# Patient Record
Sex: Female | Born: 1975 | Race: White | Hispanic: No | Marital: Married | Smoking: Former smoker
Health system: Southern US, Community
[De-identification: ages and names within clinical notes are randomized; demographics above are authoritative.]

## PROBLEM LIST (undated history)

## (undated) DIAGNOSIS — J4520 Mild intermittent asthma, uncomplicated: Secondary | ICD-10-CM

## (undated) DIAGNOSIS — T783XXA Angioneurotic edema, initial encounter: Secondary | ICD-10-CM

## (undated) DIAGNOSIS — L509 Urticaria, unspecified: Secondary | ICD-10-CM

## (undated) DIAGNOSIS — J45909 Unspecified asthma, uncomplicated: Secondary | ICD-10-CM

## (undated) DIAGNOSIS — G8929 Other chronic pain: Secondary | ICD-10-CM

## (undated) HISTORY — PX: TUBAL LIGATION: SHX77

## (undated) HISTORY — PX: BACK SURGERY: SHX140

## (undated) HISTORY — PX: ENDOMETRIAL ABLATION: SHX621

## (undated) HISTORY — DX: Urticaria, unspecified: L50.9

---
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
## 2016-12-24 DIAGNOSIS — J189 Pneumonia, unspecified organism: Secondary | ICD-10-CM | POA: Insufficient documentation

## 2016-12-24 DIAGNOSIS — E876 Hypokalemia: Secondary | ICD-10-CM | POA: Insufficient documentation

## 2016-12-24 DIAGNOSIS — R197 Diarrhea, unspecified: Secondary | ICD-10-CM | POA: Insufficient documentation

## 2016-12-24 DIAGNOSIS — J9601 Acute respiratory failure with hypoxia: Secondary | ICD-10-CM | POA: Insufficient documentation

## 2018-06-17 ENCOUNTER — Emergency Department
Admission: EM | Admit: 2018-06-17 | Discharge: 2018-06-17 | Disposition: A | Discharge status: Home / Self Care | Payer: BLUE CROSS/BLUE SHIELD | Source: Home / Self Care | Attending: Family Medicine | Admitting: Family Medicine

## 2018-06-17 ENCOUNTER — Other Ambulatory Visit: Payer: Self-pay

## 2018-06-17 ENCOUNTER — Emergency Department (INDEPENDENT_AMBULATORY_CARE_PROVIDER_SITE_OTHER): Payer: BLUE CROSS/BLUE SHIELD

## 2018-06-17 DIAGNOSIS — M7732 Calcaneal spur, left foot: Secondary | ICD-10-CM

## 2018-06-17 DIAGNOSIS — S93602A Unspecified sprain of left foot, initial encounter: Secondary | ICD-10-CM | POA: Diagnosis not present

## 2018-06-17 DIAGNOSIS — M7989 Other specified soft tissue disorders: Secondary | ICD-10-CM

## 2018-06-17 NOTE — ED Provider Notes (Signed)
[facility]    CSN: [id number] Arrival date & time: 06/17/18  [place]     History   Chief Complaint Chief Complaint  Patient presents with  . Foot Pain    HPI Amy Pope is a 42 y.o. female.   Patient tripped over a railroad tie in her yard about two hours ago, twisting her left foot.  The history is provided by the patient and the spouse.  Foot Injury  Location:  Foot Time since incident:  2 hours Injury: yes   Mechanism of injury comment:  Tripped on log Foot location:  L foot Pain details:    Quality:  Aching   Radiates to:  Does not radiate   Severity:  Moderate   Onset quality:  Sudden   Duration:  2 hours   Timing:  Constant   Progression:  Unchanged Chronicity:  New Dislocation: no   Prior injury to area:  No Relieved by:  None tried Worsened by:  Bearing weight Ineffective treatments:  Ice Associated symptoms: decreased ROM, stiffness and swelling   Associated symptoms: no muscle weakness, no numbness and no tingling   Risk factors: obesity     History reviewed. No pertinent past medical history.  There are no active problems to display for this patient.   Past Surgical History:  Procedure Laterality Date  . BACK SURGERY    . CESAREAN SECTION      OB History   None      Home Medications    Prior to Admission medications   Medication Sig Start Date End Date Taking? Authorizing Provider  ALPRAZolam Duanne Moron) 1 MG tablet Take 1 mg by mouth at bedtime as needed for anxiety.   Yes [provider]  ibuprofen (ADVIL,MOTRIN) 800 MG tablet Take 800 mg by mouth every 8 (eight) hours as needed (duexis).   Yes [provider]    Family History History reviewed. No pertinent family history.  Social History Social History   Tobacco Use  . Smoking status: Former Research scientist (life sciences)  . Smokeless tobacco: Never Used  Substance Use Topics  . Alcohol use: Not Currently  . Drug use: Not Currently     Allergies   Morphine and  related   Review of Systems Review of Systems  Musculoskeletal: Positive for stiffness.  All other systems reviewed and are negative.    Physical Exam Triage Vital Signs ED Triage Vitals  Enc Vitals Group     BP 06/17/18 1922 118/80     Pulse Rate 06/17/18 1922 (!) 105     Resp --      Temp 06/17/18 1922 98 F (36.7 C)     Temp Source 06/17/18 1922 Oral     SpO2 06/17/18 1922 100 %     Weight 06/17/18 1923 210 lb (95.3 kg)     Height 06/17/18 1923 5' 9"  (1.753 m)     Head Circumference --      Peak Flow --      Pain Score 06/17/18 1922 5     Pain Loc --      Pain Edu? --      Excl. in [place]? --    No data found.  Updated Vital Signs BP 118/80 (BP Location: Right Arm)   Pulse (!) 105   Temp 98 F (36.7 C) (Oral)   Ht 5' 9"  (1.753 m)   Wt 210 lb (95.3 kg)   SpO2 100%   BMI 31.01 kg/m   Visual Acuity Right Eye  Distance:   Left Eye Distance:   Bilateral Distance:    Right Eye Near:   Left Eye Near:    Bilateral Near:     Physical Exam  Constitutional: She appears well-developed and well-nourished. No distress.  HENT:  Head: Atraumatic.  Eyes: Pupils are equal, round, and reactive to light.  Cardiovascular: Normal rate.  Pulmonary/Chest: Effort normal.  Musculoskeletal: She exhibits no edema.       Left foot: There is tenderness and swelling. There is no deformity.       Feet:  Left foot has swelling and tenderness dorsal/lateral aspect as noted on diagram.  Ankle has full range of motion without tenderness.  Distal neurovascular function is intact.   Neurological: She is alert.  Skin: Skin is warm and dry.  Nursing note and vitals reviewed.    UC Treatments / Results  Labs (all labs ordered are listed, but only abnormal results are displayed) Labs Reviewed - No data to display  EKG None  Radiology Dg Foot Complete Left  Result Date: 06/17/2018 CLINICAL DATA:  Lateral foot pain and swelling after fall approximately 1 hour ago into a hole. EXAM:  LEFT FOOT - COMPLETE 3+ VIEW COMPARISON:  None. FINDINGS: The Lisfranc articulation appears congruent. No fracture of the foot is noted. Plantar calcaneal enthesophyte is seen. There is soft tissue swelling over the lateral aspect of the midfoot. IMPRESSION: 1. Soft tissue swelling over the lateral aspect of the midfoot. 2. No acute fracture or malalignment is identified. 3. Calcaneal enthesopathy is seen along the plantar aspect. Electronically Signed   By: Ashley Royalty M.D.   On: 06/17/2018 19:47    Procedures Procedures (including critical care time)  Medications Ordered in UC Medications - No data to display  Initial Impression / Assessment and Plan / UC Course  I have reviewed the triage vital signs and the nursing notes.  Pertinent labs & imaging results that were available during my care of the patient were reviewed by me and considered in my medical decision making (see chart for details).    Ace wrap applied.  Dispensed crutehes. Followup with Dr. Aundria Mems or Dr. Lynne Leader ([facility]) if not improving about two weeks.    Final Clinical Impressions(s) / UC Diagnoses   Final diagnoses:  Foot sprain, left, initial encounter     Discharge Instructions     Apply ice pack for 30 minutes every 1 to 2 hours today and tomorrow.  Elevate.  Use crutches for 3 to 5 days.  Wear Ace wrap until swelling decreases.  Begin range of motion and stretching exercises in about 5 days as per instruction sheet.  May increase Ibuprofen 870m to every 8 hours with food.     ED Prescriptions    None        BKandra Nicolas MD 06/18/18 [phone]

## 2018-06-17 NOTE — Discharge Instructions (Addendum)
Apply ice pack for 30 minutes every 1 to 2 hours today and tomorrow.  Elevate.  Use crutches for 3 to 5 days.  Wear Ace wrap until swelling decreases.  Begin range of motion and stretching exercises in about 5 days as per instruction sheet.  May increase Ibuprofen 878m to every 8 hours with food.

## 2018-06-17 NOTE — ED Triage Notes (Signed)
Pt fell over a log this afternoon around 6:15.  Injured right foot.  Has swelling and bruising on the lateral side of left foot.

## 2019-07-31 DIAGNOSIS — F419 Anxiety disorder, unspecified: Secondary | ICD-10-CM | POA: Insufficient documentation

## 2019-07-31 DIAGNOSIS — Z8739 Personal history of other diseases of the musculoskeletal system and connective tissue: Secondary | ICD-10-CM | POA: Insufficient documentation

## 2019-07-31 DIAGNOSIS — M545 Low back pain, unspecified: Secondary | ICD-10-CM | POA: Insufficient documentation

## 2019-09-28 ENCOUNTER — Encounter: Payer: Self-pay | Admitting: Allergy and Immunology

## 2019-09-28 ENCOUNTER — Other Ambulatory Visit: Payer: Self-pay

## 2019-09-28 ENCOUNTER — Ambulatory Visit: Payer: BLUE CROSS/BLUE SHIELD | Admitting: Allergy and Immunology

## 2019-09-28 VITALS — BP 130/82 | HR 72 | Temp 97.9°F | Resp 18 | Ht 68.9 in | Wt 223.8 lb

## 2019-09-28 DIAGNOSIS — J3089 Other allergic rhinitis: Secondary | ICD-10-CM | POA: Diagnosis not present

## 2019-09-28 DIAGNOSIS — L500 Allergic urticaria: Secondary | ICD-10-CM | POA: Insufficient documentation

## 2019-09-28 DIAGNOSIS — H1013 Acute atopic conjunctivitis, bilateral: Secondary | ICD-10-CM

## 2019-09-28 DIAGNOSIS — J4520 Mild intermittent asthma, uncomplicated: Secondary | ICD-10-CM | POA: Insufficient documentation

## 2019-09-28 DIAGNOSIS — H109 Unspecified conjunctivitis: Secondary | ICD-10-CM | POA: Insufficient documentation

## 2019-09-28 DIAGNOSIS — T7840XD Allergy, unspecified, subsequent encounter: Secondary | ICD-10-CM

## 2019-09-28 DIAGNOSIS — J302 Other seasonal allergic rhinitis: Secondary | ICD-10-CM | POA: Insufficient documentation

## 2019-09-28 DIAGNOSIS — T7840XA Allergy, unspecified, initial encounter: Secondary | ICD-10-CM | POA: Insufficient documentation

## 2019-09-28 DIAGNOSIS — H1010 Acute atopic conjunctivitis, unspecified eye: Secondary | ICD-10-CM | POA: Insufficient documentation

## 2019-09-28 HISTORY — DX: Mild intermittent asthma, uncomplicated: J45.20

## 2019-09-28 MED ORDER — PAZEO 0.7 % OP SOLN: 1.0000 [drp] | OPHTHALMIC | 5 refills | Status: DC

## 2019-09-28 MED ORDER — AZELASTINE HCL 0.15 % NA SOLN: 1.0000 | Freq: Two times a day (BID) | NASAL | 5 refills | Status: DC | PRN

## 2019-09-28 MED ORDER — ALBUTEROL SULFATE HFA 108 (90 BASE) MCG/ACT IN AERS: 1.0000 | INHALATION_SPRAY | RESPIRATORY_TRACT | 2 refills | Status: DC | PRN

## 2019-09-28 MED ORDER — LEVOCETIRIZINE DIHYDROCHLORIDE 5 MG PO TABS: 5.0000 mg | ORAL_TABLET | Freq: Every day | ORAL | 5 refills | Status: DC

## 2019-09-28 NOTE — Assessment & Plan Note (Signed)
   Treatment plan as outlined above for allergic rhinitis.  A prescription has been provided for Pazeo, one drop per eye daily as needed.

## 2019-09-28 NOTE — Patient Instructions (Addendum)
Allergic reaction The patients history suggests allergic reaction with an unclear trigger. Food allergen skin tests were negative today despite a positive histamine control.  The negative predictive value for skin tests is excellent (greater than 95%). Environmental skin test results revealed reactivity to dog epithelia, cat hair, dust mite antigen, molds, cockroach antigen, and weed pollen.  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  To be thorough, we will proceed with in vitro lab studies to help establish an etiology.  The following labs have been ordered: FCeRI antibody, anti-thyroglobulin antibody, thyroid peroxidase antibody, baseline serum tryptase, C4, CBC, CMP, ESR, ANA, and serum specific IgE against alpha gal panel.   For now, I have recommended levocetirizine (Xyzal) 5 mg daily.  A prescription has been provided.  Should symptoms recur, a journal is to be kept recording any foods eaten, beverages consumed, medications taken within a 6 hour period prior to the onset of symptoms, as well as activities performed, and environmental conditions. For any symptoms concerning for anaphylaxis, epinephrine is to be administered and 911 is to be called immediately.  Other allergic rhinitis  Aeroallergen avoidance measures have been discussed and provided in written form.  Levocetirizine(Xyzal) has been prescribed (as above).  To avoid diminishing benefit with daily use (tachyphylaxis) of second generation antihistamine, consider alternating every few months between fexofenadine (Allegra) and levocetirizine (Xyzal).  A prescription has been provided for azelastine nasal spray, 1-2 sprays per nostril 2 times daily as needed. Proper nasal spray technique has been discussed and demonstrated.   Nasal saline spray (i.e., Simply Saline) or nasal saline lavage (i.e., NeilMed) is recommended as needed and  prior to medicated nasal sprays.  Allergic conjunctivitis  Treatment plan as outlined above for allergic rhinitis.  A prescription has been provided for Pazeo, one drop per eye daily as needed.  Mild intermittent asthma  A prescription has been provided for albuterol HFA, 1 to 2 inhalations every 4-6 hours if needed.  Subjective and objective measures of pulmonary function will be followed and the treatment plan will be adjusted accordingly.   When lab results have returned you will be called with further recommendations. With the newly implemented Cures Act, the labs may be visible to you at the same time they become visible to [place]. However, the results will typically not be addressed until all of the results are back, so please be patient.  Until you have heard from [place], please continue the treatment plan as outlined on your take home sheet.   Control of Dust Mite Allergen  House dust mites play a major role in allergic asthma and rhinitis.  They occur in environments with high humidity wherever human skin, the food for dust mites is found. High levels have been detected in dust obtained from mattresses, pillows, carpets, upholstered furniture, bed covers, clothes and soft toys.  The principal allergen of the house dust mite is found in its feces.  A gram of dust may contain 1,000 mites and 250,000 fecal particles.  Mite antigen is easily measured in the air during house cleaning activities.    1. Encase mattresses, including the box spring, and pillow, in an air tight cover.  Seal the zipper end of the encased mattresses with wide adhesive tape. 2. Wash the bedding in water of 130 degrees Farenheit weekly.  Avoid cotton comforters/quilts and flannel bedding: the most ideal bed covering is  the dacron comforter. 3. Remove all upholstered furniture from the bedroom. 4. Remove carpets, carpet padding, rugs, and non-washable window drapes from the bedroom.  Wash drapes weekly or use plastic  window coverings. 5. Remove all non-washable stuffed toys from the bedroom.  Wash stuffed toys weekly. 6. Have the room cleaned frequently with a vacuum cleaner and a damp dust-mop.  The patient should not be in a room which is being cleaned and should wait 1 hour after cleaning before going into the room. 7. Close and seal all heating outlets in the bedroom.  Otherwise, the room will become filled with dust-laden air.  An electric heater can be used to heat the room. Reduce indoor humidity to less than 50%.  Do not use a humidifier.   Reducing Pollen Exposure  The American Academy of Allergy, Asthma and Immunology suggests the following steps to reduce your exposure to pollen during allergy seasons.    1. Do not hang sheets or clothing out to dry; pollen may collect on these items. 2. Do not mow lawns or spend time around freshly cut grass; mowing stirs up pollen. 3. Keep windows closed at night.  Keep car windows closed while driving. 4. Minimize morning activities outdoors, a time when pollen counts are usually at their highest. 5. Stay indoors as much as possible when pollen counts or humidity is high and on windy days when pollen tends to remain in the air longer. 6. Use air conditioning when possible.  Many air conditioners have filters that trap the pollen spores. 7. Use a HEPA room air filter to remove pollen form the indoor air you breathe.   Control of Dog or Cat Allergen  Avoidance is the best way to manage a dog or cat allergy. If you have a dog or cat and are allergic to dog or cats, consider removing the dog or cat from the home. If you have a dog or cat but don't want to find it a new home, or if your family wants a pet even though someone in the household is allergic, here are some strategies that may help keep symptoms at bay:  1. Keep the pet out of your bedroom and restrict it to only a few rooms. Be advised that keeping the dog or cat in only one room will not limit the  allergens to that room. 2. Don't pet, hug or kiss the dog or cat; if you do, wash your hands with soap and water. 3. High-efficiency particulate air (HEPA) cleaners run continuously in a bedroom or living room can reduce allergen levels over time. 4. Place electrostatic material sheet in the air inlet vent in the bedroom. 5. Regular use of a high-efficiency vacuum cleaner or a central vacuum can reduce allergen levels. 6. Giving your dog or cat a bath at least once a week can reduce airborne allergen.   Control of Mold Allergen  Mold and fungi can grow on a variety of surfaces provided certain temperature and moisture conditions exist.  Outdoor molds grow on plants, decaying vegetation and soil.  The major outdoor mold, Alternaria and Cladosporium, are found in very high numbers during hot and dry conditions.  Generally, a late Summer - Fall peak is seen for common outdoor fungal spores.  Rain will temporarily lower outdoor mold spore count, but counts rise rapidly when the rainy period ends.  The most important indoor molds are Aspergillus and Penicillium.  Dark, humid and poorly ventilated basements are ideal sites for mold growth.  The next most common sites of mold growth are the bathroom and the kitchen.  Outdoor [employer] 1. Use air conditioning and keep windows closed 2. Avoid exposure to decaying vegetation. 3. Avoid leaf raking. 4. Avoid grain handling. 5. Consider wearing a face mask if working in moldy areas.  Indoor Mold Control 1. Maintain humidity below 50%. 2. Clean washable surfaces with 5% bleach solution. 3. Remove sources e.g. Contaminated carpets.   Control of Cockroach Allergen  Cockroach allergen has been identified as an important cause of acute attacks of asthma, especially in urban settings.  There are fifty-five species of cockroach that exist in the [place], however only three, the [place], [place] species produce allergen that can affect  patients with Asthma.  Allergens can be obtained from fecal particles, egg casings and secretions from cockroaches.    1. Remove food sources. 2. Reduce access to water. 3. Seal access and entry points. 4. Spray runways with 0.5-1% Diazinon or Chlorpyrifos 5. Blow boric acid power under stoves and refrigerator. 6. Place bait stations (hydramethylnon) at feeding sites.

## 2019-09-28 NOTE — Assessment & Plan Note (Signed)
   A prescription has been provided for albuterol HFA, 1 to 2 inhalations every 4-6 hours if needed.  Subjective and objective measures of pulmonary function will be followed and the treatment plan will be adjusted accordingly.

## 2019-09-28 NOTE — Assessment & Plan Note (Addendum)
   Aeroallergen avoidance measures have been discussed and provided in written form.  Levocetirizine(Xyzal) has been prescribed (as above).  To avoid diminishing benefit with daily use (tachyphylaxis) of second generation antihistamine, consider alternating every few months between fexofenadine (Allegra) and levocetirizine (Xyzal).  A prescription has been provided for azelastine nasal spray, 1-2 sprays per nostril 2 times daily as needed. Proper nasal spray technique has been discussed and demonstrated.   Nasal saline spray (i.e., Simply Saline) or nasal saline lavage (i.e., NeilMed) is recommended as needed and prior to medicated nasal sprays.  The risks and benefits of aeroallergen immunotherapy have been discussed. The patient is motivated to initiate immunotherapy if insurance coverage is favorable. She will let us know how she would like to proceed.

## 2019-09-28 NOTE — Assessment & Plan Note (Addendum)
The patients history suggests allergic reaction with an unclear trigger. Food allergen skin tests were negative today despite a positive histamine control.  The negative predictive value for skin tests is excellent (greater than 95%). Environmental skin test results revealed reactivity to dog epithelia, cat hair, dust mite antigen, molds, cockroach antigen, and weed pollen.  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  To be thorough, we will proceed with in vitro lab studies to help establish an etiology.  The following labs have been ordered: FCeRI antibody, anti-thyroglobulin antibody, thyroid peroxidase antibody, baseline serum tryptase, C4, CBC, CMP, ESR, ANA, and serum specific IgE against alpha gal panel.   For now, I have recommended levocetirizine (Xyzal) 5 mg daily.  A prescription has been provided.  Should symptoms recur, a journal is to be kept recording any foods eaten, beverages consumed, medications taken within a 6 hour period prior to the onset of symptoms, as well as activities performed, and environmental conditions. For any symptoms concerning for anaphylaxis, epinephrine is to be administered and 911 is to be called immediately.

## 2019-09-28 NOTE — Progress Notes (Addendum)
New Patient Note  RE: Amy Pope MRN: [id number] DOB: 02/16/1976 Date of Office Visit: 09/28/2019  Referring provider: Bonnita Nasuti, MD Primary care provider: Bonnita Nasuti, MD  Chief Complaint: Allergic Reaction, Urticaria, and Wheezing   History of present illness: Amy Pope is a 43 y.o. female seen today in consultation requested by Donnetta Hutching, MD.   Over the past 5 years, [name] has experienced recurrent episodes of hives.  The lesions are described as erythematous, raised, and pruritic.  Individual hives last less than 24 hours without leaving residual pigmentation or bruising. She experiences concomitant systemic symptoms including abdominal cramping and diarrhea on multiple occasions and swelling of the lips, feet, and throat on one occasion requiring evaluation and treatment in the ER. She currently has access to epinephrine auto-injectors.  She has not experienced unexpected weight loss, recurrent fevers or drenching night sweats. She suspects triggers include dogs, or dog grooming products, and she is a Air traffic controller. 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 The symptoms do not seem to correlate with NSAIDs use or emotional stress. She did not have symptoms consistent with a respiratory tract infection at the time of symptom onset. Amy Pope has tried to control symptoms with diphenhydramine as needed which has offered adequate temporary relief. She has been evaluated and treated in the emergency department for these symptoms. Skin biopsy has not been performed. Recently, she has been experiencing hives a few times a month. Amy Pope reports that she has experienced episodes of wheezing since her early 55s and that she is "prone to bronchitis and pneumonia."  She had smoked for 26 years and quit 4 years ago.  Currently, she wheezes 1 time per month on average and  no longer has access to albuterol. Amy Pope experiences nasal congestion, rhinorrhea, sneezing, postnasal drainage, nasal pruritus, and ocular pruritus.  These symptoms occur year around but are more frequent and severe during the springtime and in the fall.  Assessment and plan: Allergic reaction The patients history suggests allergic reaction with an unclear trigger. Food allergen skin tests were negative today despite a positive histamine control.  The negative predictive value for skin tests is excellent (greater than 95%). Environmental skin test results revealed reactivity to dog epithelia, cat hair, dust mite antigen, molds, cockroach antigen, and weed pollen.  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  To be thorough, we will proceed with in vitro lab studies to help establish an etiology.  The following labs have been ordered: FCeRI antibody, anti-thyroglobulin antibody, thyroid peroxidase antibody, baseline serum tryptase, C4, CBC, CMP, ESR, ANA, and serum specific IgE against alpha gal panel.   For now, I have recommended levocetirizine (Xyzal) 5 mg daily.  A prescription has been provided.  Should symptoms recur, a journal is to be kept recording any foods eaten, beverages consumed, medications taken within a 6 hour period prior to the onset of symptoms, as well as activities performed, and environmental conditions. For any symptoms concerning for anaphylaxis, epinephrine is to be administered and 911 is to be called immediately.  Perennial and seasonal allergic rhinitis  Aeroallergen avoidance measures have been discussed and provided in written form.  Levocetirizine(Xyzal) has been prescribed (as above).  To avoid diminishing benefit with  daily use (tachyphylaxis) of second generation antihistamine, consider alternating every few months between fexofenadine (Allegra) and levocetirizine  (Xyzal).  A prescription has been provided for azelastine nasal spray, 1-2 sprays per nostril 2 times daily as needed. Proper nasal spray technique has been discussed and demonstrated.   Nasal saline spray (i.e., Simply Saline) or nasal saline lavage (i.e., NeilMed) is recommended as needed and prior to medicated nasal sprays.  The risks and benefits of aeroallergen immunotherapy have been discussed. The patient is motivated to initiate immunotherapy if insurance coverage is favorable. She will let us know how she would like to proceed.  Allergic conjunctivitis  Treatment plan as outlined above for allergic rhinitis.  A prescription has been provided for Pazeo, one drop per eye daily as needed.  Mild intermittent asthma  A prescription has been provided for albuterol HFA, 1 to 2 inhalations every 4-6 hours if needed.  Subjective and objective measures of pulmonary function will be followed and the treatment plan will be adjusted accordingly.   Meds ordered this encounter  Medications  . levocetirizine (XYZAL) 5 MG tablet    Sig: Take 1 tablet (5 mg total) by mouth daily.    Dispense:  30 tablet    Refill:  5  . Azelastine HCl 0.15 % SOLN    Sig: Place 1-2 sprays into both nostrils 2 (two) times daily as needed.    Dispense:  30 mL    Refill:  5  . Olopatadine HCl (PAZEO) 0.7 % SOLN    Sig: Place 1 drop into both eyes 1 day or 1 dose.    Dispense:  2.5 mL    Refill:  5  . albuterol (VENTOLIN HFA) 108 (90 Base) MCG/ACT inhaler    Sig: Inhale 1-2 puffs into the lungs as needed for wheezing or shortness of breath (every 4-6 hours).    Dispense:  18 g    Refill:  2    Diagnostics: Spirometry: FVC was 2.91 L (68% predicted) and FEV1 was 2.37 L (69% predicted) with an FEV1 ratio of 100% without postbronchodilator improvement.  Mild restrictive pattern is most likely secondary to body habitus. Environmental skin testing: Positive to weed pollen, molds, cat hair, dog epithelia,  cockroach antigen, and dust mite antigen. Food allergen skin testing: Negative despite a positive histamine control.    Physical examination: Blood pressure 130/82, pulse 72, temperature 97.9 F (36.6 C), temperature source Oral, resp. rate 18, height 5' 8.9" (1.75 m), weight 223 lb 12.8 oz (101.5 kg), SpO2 97 %.  General: Alert, interactive, in no acute distress. HEENT: TMs pearly gray, turbinates moderately edematous with clear discharge, post-pharynx moderately erythematous. Neck: Supple without lymphadenopathy. Lungs: Clear to auscultation without wheezing, rhonchi or rales. CV: Normal S1, S2 without murmurs. Abdomen: Nondistended, nontender. Skin: Warm and dry, without lesions or rashes. Extremities:  No clubbing, cyanosis or edema. Neuro:   Grossly intact.  Review of systems:  Review of systems negative except as noted in HPI / PMHx or noted below: Review of Systems  Constitutional: Negative.   HENT: Negative.   Eyes: Negative.   Respiratory: Negative.   Cardiovascular: Negative.   Gastrointestinal: Negative.   Genitourinary: Negative.   Musculoskeletal: Negative.   Skin: Negative.   Neurological: Negative.   Endo/Heme/Allergies: Negative.   Psychiatric/Behavioral: Negative.     Past medical history:  Past Medical History:  Diagnosis Date  . Angio-edema   . Mild intermittent asthma 09/28/2019  . Urticaria     Past surgical history:  Past Surgical History:  Procedure Laterality Date  . BACK SURGERY    . CESAREAN SECTION      Family history: Family History  Problem Relation Age of Onset  . Urticaria Mother   . Eczema Sister   . Asthma Maternal Aunt   . Allergic rhinitis Neg Hx     Social history: Social History   Socioeconomic History  . Marital status: Married    Spouse name: Not on file  . Number of children: Not on file  . Years of education: Not on file  . Highest education level: Not on file  Occupational History  . Not on file  Social  Needs  . Financial resource strain: Not on file  . Food insecurity    Worry: Not on file    Inability: Not on file  . Transportation needs    Medical: Not on file    Non-medical: Not on file  Tobacco Use  . Smoking status: Former Smoker    Types: Cigarettes    Quit date: 01/29/2015    Years since quitting: 4.6  . Smokeless tobacco: Never Used  Substance and Sexual Activity  . Alcohol use: Not Currently  . Drug use: Not Currently  . Sexual activity: Not on file  Lifestyle  . Physical activity    Days per week: Not on file    Minutes per session: Not on file  . Stress: Not on file  Relationships  . Social Herbalist on phone: Not on file    Gets together: Not on file    Attends religious service: Not on file    Active member of club or organization: Not on file    Attends meetings of clubs or organizations: Not on file    Relationship status: Not on file  . Intimate partner violence    Fear of current or ex partner: Not on file    Emotionally abused: Not on file    Physically abused: Not on file    Forced sexual activity: Not on file  Other Topics Concern  . Not on file  Social History Narrative  . Not on file    Environmental History: The patient lives in a 44 year old house with hardwood floors throughout and central air/heat.  There is no known mold/water damage in the home.  There are dogs, a cat, and a rabbit in the home, the dogs and cat have access to her bedroom.  She is a Air traffic controller by trade.  She is a non-smoker and is not exposed to secondhand cigarette smoke in the house or car.  Current Outpatient Medications  Medication Sig Dispense Refill  . ALPRAZolam (XANAX) 1 MG tablet Take 1 mg by mouth at bedtime as needed for anxiety.    Marland Kitchen ibuprofen (ADVIL,MOTRIN) 800 MG tablet Take 800 mg by mouth every 8 (eight) hours as needed (duexis).    . Misc Natural Products (ESTROVEN ENERGY PO) Take by mouth.    . Misc Natural Products (PRO HERBS IMMUNE DEFENSE PO)  Take by mouth.    . Multiple Vitamin (MULTIVITAMIN) tablet Take 1 tablet by mouth daily.    Marland Kitchen albuterol (VENTOLIN HFA) 108 (90 Base) MCG/ACT inhaler Inhale 1-2 puffs into the lungs as needed for wheezing or shortness of breath (every 4-6 hours). 18 g 2  . Azelastine HCl 0.15 % SOLN Place 1-2 sprays into both nostrils 2 (two) times daily as needed. 30 mL 5  . levocetirizine (XYZAL) 5 MG tablet Take 1  tablet (5 mg total) by mouth daily. 30 tablet 5  . Olopatadine HCl (PAZEO) 0.7 % SOLN Place 1 drop into both eyes 1 day or 1 dose. 2.5 mL 5   No current facility-administered medications for this visit.     Known medication allergies: Allergies  Allergen Reactions  . Morphine Swelling  . Morphine And Related     I appreciate the opportunity to take part in Rache's care. Please do not hesitate to contact me with questions.  Sincerely,   R. Edgar Frisk, MD

## 2019-10-05 DIAGNOSIS — J3089 Other allergic rhinitis: Secondary | ICD-10-CM

## 2019-10-05 LAB — COMPREHENSIVE METABOLIC PANEL
ALT: 18 IU/L (ref 0–32)
AST: 14 IU/L (ref 0–40)
Albumin/Globulin Ratio: 1.6 (ref 1.2–2.2)
Albumin: 4.5 g/dL (ref 3.8–4.8)
Alkaline Phosphatase: 69 IU/L (ref 39–117)
BUN/Creatinine Ratio: 23 (ref 9–23)
BUN: 14 mg/dL (ref 6–24)
Bilirubin Total: 0.3 mg/dL (ref 0.0–1.2)
CO2: 21 mmol/L (ref 20–29)
Calcium: 10.3 mg/dL — ABNORMAL HIGH (ref 8.7–10.2)
Chloride: 103 mmol/L (ref 96–106)
Creatinine, Ser: 0.62 mg/dL (ref 0.57–1.00)
GFR calc Af Amer: 127 mL/min/{1.73_m2} (ref >59)
GFR calc non Af Amer: 110 mL/min/{1.73_m2} (ref >59)
Globulin, Total: 2.8 g/dL (ref 1.5–4.5)
Glucose: 140 mg/dL — ABNORMAL HIGH (ref 65–99)
Potassium: 4.1 mmol/L (ref 3.5–5.2)
Sodium: 140 mmol/L (ref 134–144)
Total Protein: 7.3 g/dL (ref 6.0–8.5)

## 2019-10-05 LAB — ANA: ANA Titer 1: NEGATIVE

## 2019-10-05 LAB — CBC
Hematocrit: 37 % (ref 34.0–46.6)
Hemoglobin: 12.7 g/dL (ref 11.1–15.9)
MCH: 31.4 pg (ref 26.6–33.0)
MCHC: 34.3 g/dL (ref 31.5–35.7)
MCV: 92 fL (ref 79–97)
Platelets: 374 10*3/uL (ref 150–450)
RBC: 4.04 x10E6/uL (ref 3.77–5.28)
RDW: 11.9 % (ref 11.7–15.4)
WBC: 7 10*3/uL (ref 3.4–10.8)

## 2019-10-05 LAB — CHRONIC URTICARIA: cu index: 1.8 (ref <10)

## 2019-10-05 LAB — ALPHA-GAL PANEL
Alpha Gal IgE*: 0.59 kU/L — ABNORMAL HIGH (ref <0.10)
Beef (Bos spp) IgE: 0.35 kU/L — ABNORMAL HIGH (ref <0.35)
Class Interpretation: 1
Lamb/Mutton (Ovis spp) IgE: 0.21 kU/L (ref <0.35)
Pork (Sus spp) IgE: 0.2 kU/L (ref <0.35)

## 2019-10-05 LAB — SEDIMENTATION RATE: Sed Rate: 19 mm/hr (ref 0–32)

## 2019-10-05 LAB — THYROID PEROXIDASE ANTIBODY: Thyroperoxidase Ab SerPl-aCnc: 12 IU/mL (ref 0–34)

## 2019-10-05 LAB — TRYPTASE: Tryptase: 4.5 ug/L (ref 2.2–13.2)

## 2019-10-05 LAB — C4 COMPLEMENT: Complement C4, Serum: 23 mg/dL (ref 12–38)

## 2019-10-05 LAB — THYROGLOBULIN LEVEL: Thyroglobulin (TG-RIA): 23 ng/mL

## 2019-10-05 NOTE — Progress Notes (Signed)
VIALS EXP 10-04-20

## 2019-10-05 NOTE — Addendum Note (Signed)
Addended by: Golda Acre C on: 10/05/2019 12:58 PM   Modules accepted: Orders

## 2019-10-09 DIAGNOSIS — J301 Allergic rhinitis due to pollen: Secondary | ICD-10-CM | POA: Diagnosis not present

## 2019-10-24 ENCOUNTER — Ambulatory Visit: Payer: BC Managed Care – PPO

## 2019-10-24 ENCOUNTER — Ambulatory Visit (INDEPENDENT_AMBULATORY_CARE_PROVIDER_SITE_OTHER): Payer: BC Managed Care – PPO

## 2019-10-24 ENCOUNTER — Other Ambulatory Visit: Payer: Self-pay

## 2019-10-24 DIAGNOSIS — J309 Allergic rhinitis, unspecified: Secondary | ICD-10-CM

## 2019-10-24 NOTE — Progress Notes (Signed)
Immunotherapy   Patient Details  Name: Amy Pope MRN: [id number] Date of Birth: Nov 14, 1976  10/24/2019  [name]  Started blue vials 1:100,000 m-cr 0.05 cc given in (L) arm and w-dm-c-d 0.05 given in the (R) arm Following schedule: A  Frequency:1-2 times per week allowing 72 hours in between injections Epi-Pen:Epi-Pen Available  Consent signed and patient instructions given.   [name] 10/24/2019, 5:11 PM

## 2019-10-30 ENCOUNTER — Ambulatory Visit (INDEPENDENT_AMBULATORY_CARE_PROVIDER_SITE_OTHER): Payer: BC Managed Care – PPO

## 2019-10-30 DIAGNOSIS — J309 Allergic rhinitis, unspecified: Secondary | ICD-10-CM | POA: Diagnosis not present

## 2019-11-03 ENCOUNTER — Ambulatory Visit (INDEPENDENT_AMBULATORY_CARE_PROVIDER_SITE_OTHER): Payer: BC Managed Care – PPO

## 2019-11-03 DIAGNOSIS — J309 Allergic rhinitis, unspecified: Secondary | ICD-10-CM | POA: Diagnosis not present

## 2019-11-06 ENCOUNTER — Ambulatory Visit (INDEPENDENT_AMBULATORY_CARE_PROVIDER_SITE_OTHER): Payer: BC Managed Care – PPO

## 2019-11-06 DIAGNOSIS — J309 Allergic rhinitis, unspecified: Secondary | ICD-10-CM

## 2019-11-09 ENCOUNTER — Ambulatory Visit (INDEPENDENT_AMBULATORY_CARE_PROVIDER_SITE_OTHER): Payer: BC Managed Care – PPO

## 2019-11-09 DIAGNOSIS — J309 Allergic rhinitis, unspecified: Secondary | ICD-10-CM | POA: Diagnosis not present

## 2019-11-13 ENCOUNTER — Ambulatory Visit (INDEPENDENT_AMBULATORY_CARE_PROVIDER_SITE_OTHER): Payer: BC Managed Care – PPO

## 2019-11-13 DIAGNOSIS — J309 Allergic rhinitis, unspecified: Secondary | ICD-10-CM | POA: Diagnosis not present

## 2019-11-16 ENCOUNTER — Ambulatory Visit (INDEPENDENT_AMBULATORY_CARE_PROVIDER_SITE_OTHER): Payer: BC Managed Care – PPO

## 2019-11-16 DIAGNOSIS — J309 Allergic rhinitis, unspecified: Secondary | ICD-10-CM

## 2019-11-20 ENCOUNTER — Ambulatory Visit (INDEPENDENT_AMBULATORY_CARE_PROVIDER_SITE_OTHER): Payer: BC Managed Care – PPO

## 2019-11-20 DIAGNOSIS — J309 Allergic rhinitis, unspecified: Secondary | ICD-10-CM | POA: Diagnosis not present

## 2019-11-27 ENCOUNTER — Ambulatory Visit (INDEPENDENT_AMBULATORY_CARE_PROVIDER_SITE_OTHER): Payer: BC Managed Care – PPO

## 2019-11-27 DIAGNOSIS — J309 Allergic rhinitis, unspecified: Secondary | ICD-10-CM | POA: Diagnosis not present

## 2019-11-30 ENCOUNTER — Ambulatory Visit (INDEPENDENT_AMBULATORY_CARE_PROVIDER_SITE_OTHER): Payer: BC Managed Care – PPO

## 2019-11-30 DIAGNOSIS — J309 Allergic rhinitis, unspecified: Secondary | ICD-10-CM

## 2019-12-04 ENCOUNTER — Telehealth: Payer: Self-pay | Admitting: Plastic Surgery

## 2019-12-04 NOTE — Telephone Encounter (Signed)
Called patient to confirm appointment scheduled for tomorrow. Patient answered the following questions: 1. To the best of your knowledge, have you been in close contact with any one with a confirmed diagnosis of COVID-19? No 2. Have you had any one or more of the following; fever, chills, cough, shortness of breath, or any flu-like symptoms? No 3. Have you been diagnosed with or have a previous diagnosis of COVID 19? No 4. I am going to go over a few other symptoms with you. Please let me know if you are experiencing any of the following: None of the below a. Ear, nose, or throat discomfort b. A sore throat c. Headache d. Muscle pain e. Diarrhea f. Loss of taste or smell

## 2019-12-05 ENCOUNTER — Other Ambulatory Visit: Payer: Self-pay

## 2019-12-05 ENCOUNTER — Ambulatory Visit: Payer: BLUE CROSS/BLUE SHIELD | Admitting: Plastic Surgery

## 2019-12-05 ENCOUNTER — Encounter: Payer: Self-pay | Admitting: Plastic Surgery

## 2019-12-05 DIAGNOSIS — G8929 Other chronic pain: Secondary | ICD-10-CM | POA: Diagnosis not present

## 2019-12-05 DIAGNOSIS — M546 Pain in thoracic spine: Secondary | ICD-10-CM | POA: Diagnosis not present

## 2019-12-05 DIAGNOSIS — M542 Cervicalgia: Secondary | ICD-10-CM

## 2019-12-05 DIAGNOSIS — N62 Hypertrophy of breast: Secondary | ICD-10-CM | POA: Diagnosis not present

## 2019-12-05 DIAGNOSIS — M549 Dorsalgia, unspecified: Secondary | ICD-10-CM | POA: Insufficient documentation

## 2019-12-05 NOTE — Progress Notes (Signed)
Patient ID: Amy Pope, female    DOB: [birth year], 44 y.o.   MRN: [id number]   Chief Complaint  Patient presents with  . Breast Problem    Mammary Hyperplasia: The patient is a 44 y.o. female with a history of mammary hyperplasia for several years.  She has extremely large breasts causing symptoms that include the following: Back pain in the upper and lower back, including neck pain. She pulls or pins her bra straps to provide better lift and relief of the pressure and pain. She notices relief by holding her breast up manually.  Her shoulder straps cause grooves and pain and pressure that requires padding for relief. Pain medication is sometimes required with motrin and tylenol.  Activities that are hindered by enlarged breasts include: exercise and running.  She has had injections in her back to help with the pain.  It is giving her some temporary relief but nothing long-lasting.  She has had surgery and physical therapy with temporary relief.  She was sent by Dr. Rolena Infante for chronic back pain.  Her breasts are extremely large and fairly symmetric but the left is longer than right.  She has hyperpigmentation of the inframammary area on both sides.  The sternal to nipple distance on the right is 34 cm and the left is 37 cm.  The IMF distance is 20 cm.  She is 5 feet 9 inches tall and weighs 231 pounds.  Preoperative bra size = 42 E cup. She would like to be a D or DD. The estimated excess breast tissue to be removed at the time of surgery = 850 grams on the left and 850 grams on the right.  Mammogram history: last year was negative but it is time for another.  She is not a smoker.   Review of Systems  Past Medical History:  Diagnosis Date  . Angio-edema   . Mild intermittent asthma 09/28/2019  . Urticaria     Past Surgical History:  Procedure Laterality Date  . BACK SURGERY    . CESAREAN SECTION        Current Outpatient Medications:  .  albuterol (VENTOLIN HFA) 108 (90 Base)  MCG/ACT inhaler, Inhale 1-2 puffs into the lungs as needed for wheezing or shortness of breath (every 4-6 hours)., Disp: 18 g, Rfl: 2 .  ALPRAZolam (XANAX) 1 MG tablet, Take 1 mg by mouth at bedtime as needed for anxiety., Disp: , Rfl:  .  Azelastine HCl 0.15 % SOLN, Place 1-2 sprays into both nostrils 2 (two) times daily as needed., Disp: 30 mL, Rfl: 5 .  ibuprofen (ADVIL,MOTRIN) 800 MG tablet, Take 800 mg by mouth every 8 (eight) hours as needed (duexis)., Disp: , Rfl:  .  levocetirizine (XYZAL) 5 MG tablet, Take 1 tablet (5 mg total) by mouth daily., Disp: 30 tablet, Rfl: 5 .  Misc Natural Products (ESTROVEN ENERGY PO), Take by mouth., Disp: , Rfl:  .  Misc Natural Products (PRO HERBS IMMUNE DEFENSE PO), Take by mouth., Disp: , Rfl:  .  Multiple Vitamin (MULTIVITAMIN) tablet, Take 1 tablet by mouth daily., Disp: , Rfl:  .  Olopatadine HCl (PAZEO) 0.7 % SOLN, Place 1 drop into both eyes 1 day or 1 dose., Disp: 2.5 mL, Rfl: 5   Objective:   Vitals:   12/05/19 1535  BP: 117/81  Pulse: 71  Temp: 97.7 F (36.5 C)  SpO2: 99%    Physical Exam  Assessment & Plan:  Symptomatic mammary hypertrophy  Chronic bilateral thoracic back pain  Neck pain  Have ordered an updated mammogram.  We will ask for a release of information for the physical therapy notes.  Recommend bilateral breast reduction.  We discussed the location of the incisions for the surgery and where she could expect the scars.  Also told her the surgery is 3-4 hours long and is a same day surgery for the most part.  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  [place], DO

## 2019-12-06 ENCOUNTER — Ambulatory Visit (INDEPENDENT_AMBULATORY_CARE_PROVIDER_SITE_OTHER): Payer: BC Managed Care – PPO

## 2019-12-06 DIAGNOSIS — J309 Allergic rhinitis, unspecified: Secondary | ICD-10-CM

## 2019-12-11 ENCOUNTER — Ambulatory Visit (INDEPENDENT_AMBULATORY_CARE_PROVIDER_SITE_OTHER): Payer: BC Managed Care – PPO

## 2019-12-11 DIAGNOSIS — J309 Allergic rhinitis, unspecified: Secondary | ICD-10-CM

## 2019-12-15 ENCOUNTER — Other Ambulatory Visit: Payer: Self-pay | Admitting: Plastic Surgery

## 2019-12-15 DIAGNOSIS — G8929 Other chronic pain: Secondary | ICD-10-CM

## 2019-12-15 DIAGNOSIS — N62 Hypertrophy of breast: Secondary | ICD-10-CM

## 2019-12-15 DIAGNOSIS — M542 Cervicalgia: Secondary | ICD-10-CM

## 2019-12-20 ENCOUNTER — Ambulatory Visit (INDEPENDENT_AMBULATORY_CARE_PROVIDER_SITE_OTHER): Payer: BC Managed Care – PPO

## 2019-12-20 DIAGNOSIS — J309 Allergic rhinitis, unspecified: Secondary | ICD-10-CM | POA: Diagnosis not present

## 2019-12-27 ENCOUNTER — Ambulatory Visit (INDEPENDENT_AMBULATORY_CARE_PROVIDER_SITE_OTHER): Payer: BC Managed Care – PPO

## 2019-12-27 DIAGNOSIS — J309 Allergic rhinitis, unspecified: Secondary | ICD-10-CM | POA: Diagnosis not present

## 2020-01-02 ENCOUNTER — Ambulatory Visit (INDEPENDENT_AMBULATORY_CARE_PROVIDER_SITE_OTHER): Payer: BC Managed Care – PPO

## 2020-01-02 DIAGNOSIS — J309 Allergic rhinitis, unspecified: Secondary | ICD-10-CM | POA: Diagnosis not present

## 2020-01-12 ENCOUNTER — Ambulatory Visit (INDEPENDENT_AMBULATORY_CARE_PROVIDER_SITE_OTHER): Payer: BC Managed Care – PPO

## 2020-01-12 DIAGNOSIS — J309 Allergic rhinitis, unspecified: Secondary | ICD-10-CM

## 2020-01-18 ENCOUNTER — Telehealth: Payer: Self-pay | Admitting: Plastic Surgery

## 2020-01-18 NOTE — Telephone Encounter (Signed)
Called patient to confirm appointment scheduled for tomorrow. Patient answered the following questions: 1. To the best of your knowledge, have you been in close contact with any one with a confirmed diagnosis of COVID-19? No 2. Have you had any one or more of the following; fever, chills, cough, shortness of breath, or any flu-like symptoms? No 3. Have you been diagnosed with or have a previous diagnosis of COVID 19? No 4. I am going to go over a few other symptoms with you. Please let me know if you are experiencing any of the following: None of the below a. Ear, nose, or throat discomfort b. A sore throat c. Headache d. Muscle pain e. Diarrhea f. Loss of taste or smell

## 2020-01-19 ENCOUNTER — Encounter: Payer: Self-pay | Admitting: Plastic Surgery

## 2020-01-19 ENCOUNTER — Ambulatory Visit: Payer: BC Managed Care – PPO | Admitting: Plastic Surgery

## 2020-01-19 ENCOUNTER — Other Ambulatory Visit: Payer: Self-pay

## 2020-01-19 ENCOUNTER — Ambulatory Visit (INDEPENDENT_AMBULATORY_CARE_PROVIDER_SITE_OTHER): Payer: BC Managed Care – PPO | Admitting: *Deleted

## 2020-01-19 VITALS — BP 129/82 | HR 72 | Temp 98.2°F | Ht 69.0 in | Wt 231.0 lb

## 2020-01-19 DIAGNOSIS — J309 Allergic rhinitis, unspecified: Secondary | ICD-10-CM | POA: Diagnosis not present

## 2020-01-19 DIAGNOSIS — N62 Hypertrophy of breast: Secondary | ICD-10-CM

## 2020-01-19 DIAGNOSIS — G8929 Other chronic pain: Secondary | ICD-10-CM

## 2020-01-19 DIAGNOSIS — M542 Cervicalgia: Secondary | ICD-10-CM

## 2020-01-19 DIAGNOSIS — M546 Pain in thoracic spine: Secondary | ICD-10-CM

## 2020-01-19 MED ORDER — CEPHALEXIN 500 MG PO CAPS: 500.0000 mg | ORAL_CAPSULE | Freq: Four times a day (QID) | ORAL | 0 refills | Status: AC

## 2020-01-19 MED ORDER — ONDANSETRON HCL 4 MG PO TABS: 4.0000 mg | ORAL_TABLET | Freq: Three times a day (TID) | ORAL | 0 refills | Status: DC | PRN

## 2020-01-19 MED ORDER — HYDROCODONE-ACETAMINOPHEN 5-325 MG PO TABS: 1.0000 | ORAL_TABLET | Freq: Three times a day (TID) | ORAL | 0 refills | Status: AC | PRN

## 2020-01-19 NOTE — Progress Notes (Signed)
ICD-10-CM   1. Symptomatic mammary hypertrophy  N62   2. Neck pain  M54.2   3. Chronic bilateral thoracic back pain  M54.6    G89.29       Patient ID: Amy Pope, female    DOB: 1976/11/17, 44 y.o.   MRN: [id number]   History of Present Illness: Amy Pope is a 44 y.o.  female  with a history of symptomatic mammary hypertrophy.  She presents for preoperative evaluation for upcoming procedure, bilateral breast reduction, scheduled for 02/01/2020 with Dr. Marla Roe.  Summary from previous visit: Symptomatic mammary hyperplasia for several years.  Chronic neck pain, back pain, limitations of activities.  She has tried injections in her back to help with the pain along with surgery and physical therapy.  She has been sent by Dr. Rolena Infante for chronic back pain.  The sternal to nipple distance on the right is 34 cm and the left is 37 cm.  The IMF distance is 20 cm.  She is 5 feet 9 inches tall and weighs 231 pounds.  Preoperative bra size equals 42 E cup.  She would like to be a D or hefty C.  The estimated excess breast tissue to be removed at the time of surgery equals 850 g on the left and 850 g on the right.  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 No personal history of breast cancer. Maternal grandmother's sister had breast cancer. Father had history of blood clots.   The patient has not had problems with anesthesia.   Past Medical History: Allergies: Allergies  Allergen Reactions  . Morphine Swelling  . Morphine And Related     Current Medications:  Current Outpatient Medications:  .  albuterol (VENTOLIN HFA) 108 (90 Base) MCG/ACT inhaler, Inhale 1-2 puffs into the lungs as needed for wheezing or shortness of breath (every 4-6 hours)., Disp: 18 g, Rfl: 2 .  ALPRAZolam (XANAX) 1 MG tablet, Take 1 mg by mouth at bedtime as needed for anxiety., Disp: , Rfl:  .  Azelastine HCl 0.15 % SOLN, Place  1-2 sprays into both nostrils 2 (two) times daily as needed., Disp: 30 mL, Rfl: 5 .  cephALEXin (KEFLEX) 500 MG capsule, Take 1 capsule (500 mg total) by mouth 4 (four) times daily for 3 days., Disp: 12 capsule, Rfl: 0 .  HYDROcodone-acetaminophen (NORCO) 5-325 MG tablet, Take 1 tablet by mouth every 8 (eight) hours as needed for up to 5 days for severe pain., Disp: 15 tablet, Rfl: 0 .  ibuprofen (ADVIL,MOTRIN) 800 MG tablet, Take 800 mg by mouth every 8 (eight) hours as needed (duexis)., Disp: , Rfl:  .  levocetirizine (XYZAL) 5 MG tablet, Take 1 tablet (5 mg total) by mouth daily., Disp: 30 tablet, Rfl: 5 .  Misc Natural Products (ESTROVEN ENERGY PO), Take by mouth., Disp: , Rfl:  .  Misc Natural Products (PRO HERBS IMMUNE DEFENSE PO), Take by mouth., Disp: , Rfl:  .  Multiple Vitamin (MULTIVITAMIN) tablet, Take 1 tablet by mouth daily., Disp: , Rfl:  .  Olopatadine HCl (PAZEO) 0.7 % SOLN, Place 1 drop into both eyes 1 day or 1 dose., Disp: 2.5 mL, Rfl: 5 .  ondansetron (ZOFRAN) 4 MG tablet, Take 1 tablet (4 mg total) by mouth every 8 (eight) hours as needed for nausea or vomiting., Disp: 20 tablet, Rfl: 0  Past Medical Problems: Past Medical History:  Diagnosis Date  .  Angio-edema   . Mild intermittent asthma 09/28/2019  . Urticaria     Past Surgical History: Past Surgical History:  Procedure Laterality Date  . BACK SURGERY    . CESAREAN SECTION      Social History: Social History   Socioeconomic History  . Marital status: Married    Spouse name: Not on file  . Number of children: Not on file  . Years of education: Not on file  . Highest education level: Not on file  Occupational History  . Not on file  Tobacco Use  . Smoking status: Former Smoker    Types: Cigarettes    Quit date: 01/29/2015    Years since quitting: 4.9  . Smokeless tobacco: Never Used  Substance and Sexual Activity  . Alcohol use: Not Currently  . Drug use: Not Currently  . Sexual activity: Not on  file  Other Topics Concern  . Not on file  Social History Narrative  . Not on file   Social Determinants of Health   Financial Resource Strain:   . Difficulty of Paying Living Expenses: Not on file  Food Insecurity:   . Worried About Charity fundraiser in the Last Year: Not on file  . Ran Out of Food in the Last Year: Not on file  Transportation Needs:   . Lack of Transportation (Medical): Not on file  . Lack of Transportation (Non-Medical): Not on file  Physical Activity:   . Days of Exercise per Week: Not on file  . Minutes of Exercise per Session: Not on file  Stress:   . Feeling of Stress : Not on file  Social Connections:   . Frequency of Communication with Friends and Family: Not on file  . Frequency of Social Gatherings with Friends and Family: Not on file  . Attends Religious Services: Not on file  . Active Member of Clubs or Organizations: Not on file  . Attends Archivist Meetings: Not on file  . Marital Status: Not on file  Intimate Partner Violence:   . Fear of Current or Ex-Partner: Not on file  . Emotionally Abused: Not on file  . Physically Abused: Not on file  . Sexually Abused: Not on file    Family History: Family History  Problem Relation Age of Onset  . Urticaria Mother   . Eczema Sister   . Asthma Maternal Aunt   . Allergic rhinitis Neg Hx     Review of Systems: Review of Systems  Constitutional: Negative for chills and fever.  HENT: Negative for congestion and sore throat.   Respiratory: Negative for cough and shortness of breath.   Cardiovascular: Negative for chest pain and palpitations.  Gastrointestinal: Negative for abdominal pain, nausea and vomiting.  Musculoskeletal: Positive for back pain and neck pain. Negative for joint pain and myalgias.  Skin: Negative for itching and rash.    Physical Exam: Vital Signs BP 129/82 (BP Location: Left Arm, Patient Position: Sitting, Cuff Size: Large)   Pulse 72   Temp 98.2 F (36.8  C) (Temporal)   Ht 5' 9"  (1.753 m)   Wt 231 lb (104.8 kg)   SpO2 98%   BMI 34.11 kg/m  Physical Exam Constitutional:      Appearance: Normal appearance. She is normal weight.  HENT:     Head: Normocephalic and atraumatic.  Eyes:     Extraocular Movements: Extraocular movements intact.  Cardiovascular:     Rate and Rhythm: Normal rate and regular rhythm.  Pulses: Normal pulses.     Heart sounds: Normal heart sounds.  Pulmonary:     Effort: Pulmonary effort is normal.     Breath sounds: Normal breath sounds. No wheezing, rhonchi or rales.  Abdominal:     General: Bowel sounds are normal.     Palpations: Abdomen is soft.  Musculoskeletal:        General: No swelling. Normal range of motion.     Cervical back: Normal range of motion.  Skin:    General: Skin is warm and dry.     Coloration: Skin is not jaundiced or pale.     Findings: No bruising, erythema, lesion or rash.  Neurological:     General: No focal deficit present.     Mental Status: She is alert and oriented to person, place, and time.  Psychiatric:        Mood and Affect: Mood normal.        Behavior: Behavior normal.        Thought Content: Thought content normal.        Judgment: Judgment normal.     Assessment/Plan:  Ms. Erlandson scheduled for bilateral breast reduction with Dr. Marla Roe.  Risks, benefits, and alternatives of procedure discussed, questions answered and consent obtained.    Smoking Status: Quit 4 years ago Last Mammogram: June 2020; Results: No findings suspicious for malignancy.  Caprini Score: 7 High; Risk Factors include: 44 year old female, BMI > 25, and length of planned surgery. Recommendation for mechanical and pharmacological prophylaxis during surgery. Encourage early ambulation.   Pictures obtained: 12/05/19 visit  Post-op Rx sent to pharmacy: Norco, Keflex, and Zofran  The 21st Century Cures Act was signed into law in 2016 which includes the topic of electronic health  records.  This provides immediate access to information in MyChart.  This includes consultation notes, operative notes, office notes, lab results and pathology reports.  If you have any questions about what you read please let us know at your next visit or call us at the office.  We are right here with you.   Electronically signed by: Threasa Heads, PA-C 01/19/2020 1:08 PM

## 2020-01-19 NOTE — H&P (View-Only) (Signed)
ICD-10-CM   1. Symptomatic mammary hypertrophy  N62   2. Neck pain  M54.2   3. Chronic bilateral thoracic back pain  M54.6    G89.29       Patient ID: Amy Pope, female    DOB: 06/04/1976, 44 y.o.   MRN: [id number]   History of Present Illness: Amy Pope is a 44 y.o.  female  with a history of symptomatic mammary hypertrophy.  She presents for preoperative evaluation for upcoming procedure, bilateral breast reduction, scheduled for 02/01/2020 with Dr. Marla Roe.  Summary from previous visit: Symptomatic mammary hyperplasia for several years.  Chronic neck pain, back pain, limitations of activities.  She has tried injections in her back to help with the pain along with surgery and physical therapy.  She has been sent by Dr. Rolena Infante for chronic back pain.  The sternal to nipple distance on the right is 34 cm and the left is 37 cm.  The IMF distance is 20 cm.  She is 5 feet 9 inches tall and weighs 231 pounds.  Preoperative bra size equals 42 E cup.  She would like to be a D or hefty C.  The estimated excess breast tissue to be removed at the time of surgery equals 850 g on the left and 850 g on the right.  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 No personal history of breast cancer. Maternal grandmother's sister had breast cancer. Father had history of blood clots.   The patient has not had problems with anesthesia.   Past Medical History: Allergies: Allergies  Allergen Reactions  . Morphine Swelling  . Morphine And Related     Current Medications:  Current Outpatient Medications:  .  albuterol (VENTOLIN HFA) 108 (90 Base) MCG/ACT inhaler, Inhale 1-2 puffs into the lungs as needed for wheezing or shortness of breath (every 4-6 hours)., Disp: 18 g, Rfl: 2 .  ALPRAZolam (XANAX) 1 MG tablet, Take 1 mg by mouth at bedtime as needed for anxiety., Disp: , Rfl:  .  Azelastine HCl 0.15 % SOLN, Place  1-2 sprays into both nostrils 2 (two) times daily as needed., Disp: 30 mL, Rfl: 5 .  cephALEXin (KEFLEX) 500 MG capsule, Take 1 capsule (500 mg total) by mouth 4 (four) times daily for 3 days., Disp: 12 capsule, Rfl: 0 .  HYDROcodone-acetaminophen (NORCO) 5-325 MG tablet, Take 1 tablet by mouth every 8 (eight) hours as needed for up to 5 days for severe pain., Disp: 15 tablet, Rfl: 0 .  ibuprofen (ADVIL,MOTRIN) 800 MG tablet, Take 800 mg by mouth every 8 (eight) hours as needed (duexis)., Disp: , Rfl:  .  levocetirizine (XYZAL) 5 MG tablet, Take 1 tablet (5 mg total) by mouth daily., Disp: 30 tablet, Rfl: 5 .  Misc Natural Products (ESTROVEN ENERGY PO), Take by mouth., Disp: , Rfl:  .  Misc Natural Products (PRO HERBS IMMUNE DEFENSE PO), Take by mouth., Disp: , Rfl:  .  Multiple Vitamin (MULTIVITAMIN) tablet, Take 1 tablet by mouth daily., Disp: , Rfl:  .  Olopatadine HCl (PAZEO) 0.7 % SOLN, Place 1 drop into both eyes 1 day or 1 dose., Disp: 2.5 mL, Rfl: 5 .  ondansetron (ZOFRAN) 4 MG tablet, Take 1 tablet (4 mg total) by mouth every 8 (eight) hours as needed for nausea or vomiting., Disp: 20 tablet, Rfl: 0  Past Medical Problems: Past Medical History:  Diagnosis Date  .  Angio-edema   . Mild intermittent asthma 09/28/2019  . Urticaria     Past Surgical History: Past Surgical History:  Procedure Laterality Date  . BACK SURGERY    . CESAREAN SECTION      Social History: Social History   Socioeconomic History  . Marital status: Married    Spouse name: Not on file  . Number of children: Not on file  . Years of education: Not on file  . Highest education level: Not on file  Occupational History  . Not on file  Tobacco Use  . Smoking status: Former Smoker    Types: Cigarettes    Quit date: 01/29/2015    Years since quitting: 4.9  . Smokeless tobacco: Never Used  Substance and Sexual Activity  . Alcohol use: Not Currently  . Drug use: Not Currently  . Sexual activity: Not on  file  Other Topics Concern  . Not on file  Social History Narrative  . Not on file   Social Determinants of Health   Financial Resource Strain:   . Difficulty of Paying Living Expenses: Not on file  Food Insecurity:   . Worried About Charity fundraiser in the Last Year: Not on file  . Ran Out of Food in the Last Year: Not on file  Transportation Needs:   . Lack of Transportation (Medical): Not on file  . Lack of Transportation (Non-Medical): Not on file  Physical Activity:   . Days of Exercise per Week: Not on file  . Minutes of Exercise per Session: Not on file  Stress:   . Feeling of Stress : Not on file  Social Connections:   . Frequency of Communication with Friends and Family: Not on file  . Frequency of Social Gatherings with Friends and Family: Not on file  . Attends Religious Services: Not on file  . Active Member of Clubs or Organizations: Not on file  . Attends Archivist Meetings: Not on file  . Marital Status: Not on file  Intimate Partner Violence:   . Fear of Current or Ex-Partner: Not on file  . Emotionally Abused: Not on file  . Physically Abused: Not on file  . Sexually Abused: Not on file    Family History: Family History  Problem Relation Age of Onset  . Urticaria Mother   . Eczema Sister   . Asthma Maternal Aunt   . Allergic rhinitis Neg Hx     Review of Systems: Review of Systems  Constitutional: Negative for chills and fever.  HENT: Negative for congestion and sore throat.   Respiratory: Negative for cough and shortness of breath.   Cardiovascular: Negative for chest pain and palpitations.  Gastrointestinal: Negative for abdominal pain, nausea and vomiting.  Musculoskeletal: Positive for back pain and neck pain. Negative for joint pain and myalgias.  Skin: Negative for itching and rash.    Physical Exam: Vital Signs BP 129/82 (BP Location: Left Arm, Patient Position: Sitting, Cuff Size: Large)   Pulse 72   Temp 98.2 F (36.8  C) (Temporal)   Ht 5' 9"  (1.753 m)   Wt 231 lb (104.8 kg)   SpO2 98%   BMI 34.11 kg/m  Physical Exam Constitutional:      Appearance: Normal appearance. She is normal weight.  HENT:     Head: Normocephalic and atraumatic.  Eyes:     Extraocular Movements: Extraocular movements intact.  Cardiovascular:     Rate and Rhythm: Normal rate and regular rhythm.  Pulses: Normal pulses.     Heart sounds: Normal heart sounds.  Pulmonary:     Effort: Pulmonary effort is normal.     Breath sounds: Normal breath sounds. No wheezing, rhonchi or rales.  Abdominal:     General: Bowel sounds are normal.     Palpations: Abdomen is soft.  Musculoskeletal:        General: No swelling. Normal range of motion.     Cervical back: Normal range of motion.  Skin:    General: Skin is warm and dry.     Coloration: Skin is not jaundiced or pale.     Findings: No bruising, erythema, lesion or rash.  Neurological:     General: No focal deficit present.     Mental Status: She is alert and oriented to person, place, and time.  Psychiatric:        Mood and Affect: Mood normal.        Behavior: Behavior normal.        Thought Content: Thought content normal.        Judgment: Judgment normal.     Assessment/Plan:  Ms. Hursey scheduled for bilateral breast reduction with Dr. Marla Roe.  Risks, benefits, and alternatives of procedure discussed, questions answered and consent obtained.    Smoking Status: Quit 4 years ago Last Mammogram: June 2020; Results: No findings suspicious for malignancy.  Caprini Score: 7 High; Risk Factors include: 44 year old female, BMI > 25, and length of planned surgery. Recommendation for mechanical and pharmacological prophylaxis during surgery. Encourage early ambulation.   Pictures obtained: 12/05/19 visit  Post-op Rx sent to pharmacy: Norco, Keflex, and Zofran  The 21st Century Cures Act was signed into law in 2016 which includes the topic of electronic health  records.  This provides immediate access to information in MyChart.  This includes consultation notes, operative notes, office notes, lab results and pathology reports.  If you have any questions about what you read please let us know at your next visit or call us at the office.  We are right here with you.   Electronically signed by: Threasa Heads, PA-C 01/19/2020 1:08 PM

## 2020-01-23 ENCOUNTER — Ambulatory Visit (INDEPENDENT_AMBULATORY_CARE_PROVIDER_SITE_OTHER): Payer: BC Managed Care – PPO

## 2020-01-23 DIAGNOSIS — J309 Allergic rhinitis, unspecified: Secondary | ICD-10-CM | POA: Diagnosis not present

## 2020-01-25 ENCOUNTER — Encounter (HOSPITAL_BASED_OUTPATIENT_CLINIC_OR_DEPARTMENT_OTHER): Payer: Self-pay | Admitting: Plastic Surgery

## 2020-01-25 ENCOUNTER — Other Ambulatory Visit: Payer: Self-pay

## 2020-01-29 ENCOUNTER — Other Ambulatory Visit (HOSPITAL_COMMUNITY)
Admission: RE | Admit: 2020-01-29 | Discharge: 2020-01-29 | Disposition: A | Discharge status: Home / Self Care | Payer: BC Managed Care – PPO | Source: Ambulatory Visit | Attending: Plastic Surgery | Admitting: Plastic Surgery

## 2020-01-29 DIAGNOSIS — Z01812 Encounter for preprocedural laboratory examination: Secondary | ICD-10-CM | POA: Diagnosis not present

## 2020-01-29 DIAGNOSIS — Z20822 Contact with and (suspected) exposure to covid-19: Secondary | ICD-10-CM | POA: Insufficient documentation

## 2020-01-30 ENCOUNTER — Ambulatory Visit (INDEPENDENT_AMBULATORY_CARE_PROVIDER_SITE_OTHER): Payer: BC Managed Care – PPO

## 2020-01-30 DIAGNOSIS — J309 Allergic rhinitis, unspecified: Secondary | ICD-10-CM

## 2020-01-30 LAB — SARS CORONAVIRUS 2 (TAT 6-24 HRS): SARS Coronavirus 2: NEGATIVE

## 2020-02-01 ENCOUNTER — Other Ambulatory Visit: Payer: Self-pay

## 2020-02-01 ENCOUNTER — Encounter (HOSPITAL_BASED_OUTPATIENT_CLINIC_OR_DEPARTMENT_OTHER)
Admission: RE | Disposition: A | Discharge status: Home / Self Care | Payer: Self-pay | Source: Home / Self Care | Attending: Plastic Surgery

## 2020-02-01 ENCOUNTER — Ambulatory Visit (HOSPITAL_BASED_OUTPATIENT_CLINIC_OR_DEPARTMENT_OTHER): Payer: BC Managed Care – PPO | Admitting: Anesthesiology

## 2020-02-01 ENCOUNTER — Encounter (HOSPITAL_BASED_OUTPATIENT_CLINIC_OR_DEPARTMENT_OTHER): Payer: Self-pay | Admitting: Plastic Surgery

## 2020-02-01 ENCOUNTER — Ambulatory Visit (HOSPITAL_BASED_OUTPATIENT_CLINIC_OR_DEPARTMENT_OTHER)
Admission: RE | Admit: 2020-02-01 | Discharge: 2020-02-01 | Disposition: A | Discharge status: Home / Self Care | Payer: BC Managed Care – PPO | Attending: Plastic Surgery | Admitting: Plastic Surgery

## 2020-02-01 DIAGNOSIS — J4520 Mild intermittent asthma, uncomplicated: Secondary | ICD-10-CM | POA: Diagnosis not present

## 2020-02-01 DIAGNOSIS — G8929 Other chronic pain: Secondary | ICD-10-CM

## 2020-02-01 DIAGNOSIS — E669 Obesity, unspecified: Secondary | ICD-10-CM | POA: Insufficient documentation

## 2020-02-01 DIAGNOSIS — N62 Hypertrophy of breast: Secondary | ICD-10-CM

## 2020-02-01 DIAGNOSIS — Z832 Family history of diseases of the blood and blood-forming organs and certain disorders involving the immune mechanism: Secondary | ICD-10-CM | POA: Diagnosis not present

## 2020-02-01 DIAGNOSIS — K219 Gastro-esophageal reflux disease without esophagitis: Secondary | ICD-10-CM | POA: Diagnosis not present

## 2020-02-01 DIAGNOSIS — Z6834 Body mass index (BMI) 34.0-34.9, adult: Secondary | ICD-10-CM | POA: Insufficient documentation

## 2020-02-01 DIAGNOSIS — Z803 Family history of malignant neoplasm of breast: Secondary | ICD-10-CM | POA: Diagnosis not present

## 2020-02-01 DIAGNOSIS — Z87891 Personal history of nicotine dependence: Secondary | ICD-10-CM | POA: Insufficient documentation

## 2020-02-01 DIAGNOSIS — M549 Dorsalgia, unspecified: Secondary | ICD-10-CM | POA: Insufficient documentation

## 2020-02-01 DIAGNOSIS — M546 Pain in thoracic spine: Secondary | ICD-10-CM

## 2020-02-01 DIAGNOSIS — Z885 Allergy status to narcotic agent status: Secondary | ICD-10-CM | POA: Insufficient documentation

## 2020-02-01 DIAGNOSIS — M542 Cervicalgia: Secondary | ICD-10-CM

## 2020-02-01 DIAGNOSIS — Z79899 Other long term (current) drug therapy: Secondary | ICD-10-CM | POA: Insufficient documentation

## 2020-02-01 HISTORY — DX: Other chronic pain: G89.29

## 2020-02-01 HISTORY — PX: BREAST REDUCTION SURGERY: SHX8

## 2020-02-01 HISTORY — DX: Unspecified asthma, uncomplicated: J45.909

## 2020-02-01 LAB — POCT PREGNANCY, URINE: Preg Test, Ur: NEGATIVE

## 2020-02-01 SURGERY — BREAST REDUCTION WITH LIPOSUCTION
Anesthesia: General | Site: Breast | Laterality: Bilateral

## 2020-02-01 MED ORDER — FENTANYL CITRATE (PF) 100 MCG/2ML IJ SOLN
25.0000 ug | INTRAMUSCULAR | Status: DC | PRN
  Administered 2020-02-01 (×2): 25 ug via INTRAVENOUS

## 2020-02-01 MED ORDER — EPHEDRINE 5 MG/ML INJ
INTRAVENOUS | Status: AC
  Filled 2020-02-01: qty 10

## 2020-02-01 MED ORDER — CHLORHEXIDINE GLUCONATE CLOTH 2 % EX PADS: 6.0000 | MEDICATED_PAD | Freq: Once | CUTANEOUS | Status: DC

## 2020-02-01 MED ORDER — ONDANSETRON HCL 4 MG/2ML IJ SOLN
INTRAMUSCULAR | Status: AC
  Filled 2020-02-01: qty 2

## 2020-02-01 MED ORDER — PHENYLEPHRINE 40 MCG/ML (10ML) SYRINGE FOR IV PUSH (FOR BLOOD PRESSURE SUPPORT)
PREFILLED_SYRINGE | INTRAVENOUS | Status: AC
  Filled 2020-02-01: qty 10

## 2020-02-01 MED ORDER — SUGAMMADEX SODIUM 500 MG/5ML IV SOLN
INTRAVENOUS | Status: DC | PRN
  Administered 2020-02-01: 200 mg via INTRAVENOUS

## 2020-02-01 MED ORDER — LIDOCAINE-EPINEPHRINE 1 %-1:100000 IJ SOLN
INTRAMUSCULAR | Status: DC | PRN
  Administered 2020-02-01: 32 mL

## 2020-02-01 MED ORDER — CEFAZOLIN SODIUM-DEXTROSE 2-4 GM/100ML-% IV SOLN
2.0000 g | INTRAVENOUS | Status: AC
  Administered 2020-02-01: 2 g via INTRAVENOUS

## 2020-02-01 MED ORDER — PROMETHAZINE HCL 25 MG/ML IJ SOLN
INTRAMUSCULAR | Status: AC
  Filled 2020-02-01: qty 1

## 2020-02-01 MED ORDER — LIDOCAINE 2% (20 MG/ML) 5 ML SYRINGE
INTRAMUSCULAR | Status: AC
  Filled 2020-02-01: qty 5

## 2020-02-01 MED ORDER — LACTATED RINGERS IV SOLN
INTRAVENOUS | Status: AC | PRN
  Administered 2020-02-01: 1000 mL

## 2020-02-01 MED ORDER — DEXAMETHASONE SODIUM PHOSPHATE 4 MG/ML IJ SOLN
INTRAMUSCULAR | Status: DC | PRN
  Administered 2020-02-01: 10 mg via INTRAVENOUS

## 2020-02-01 MED ORDER — FENTANYL CITRATE (PF) 100 MCG/2ML IJ SOLN
INTRAMUSCULAR | Status: AC
  Filled 2020-02-01: qty 2

## 2020-02-01 MED ORDER — FENTANYL CITRATE (PF) 100 MCG/2ML IJ SOLN: 25.0000 ug | INTRAMUSCULAR | Status: DC | PRN

## 2020-02-01 MED ORDER — 0.9 % SODIUM CHLORIDE (POUR BTL) OPTIME
TOPICAL | Status: DC | PRN
  Administered 2020-02-01: 1000 mL

## 2020-02-01 MED ORDER — ROCURONIUM BROMIDE 100 MG/10ML IV SOLN
INTRAVENOUS | Status: DC | PRN
  Administered 2020-02-01: 60 mg via INTRAVENOUS

## 2020-02-01 MED ORDER — DEXAMETHASONE SODIUM PHOSPHATE 10 MG/ML IJ SOLN
INTRAMUSCULAR | Status: AC
  Filled 2020-02-01: qty 1

## 2020-02-01 MED ORDER — ACETAMINOPHEN 325 MG PO TABS: 650.0000 mg | ORAL_TABLET | ORAL | Status: DC | PRN

## 2020-02-01 MED ORDER — SODIUM CHLORIDE 0.9% FLUSH: 3.0000 mL | INTRAVENOUS | Status: DC | PRN

## 2020-02-01 MED ORDER — LACTATED RINGERS IV SOLN: INTRAVENOUS | Status: DC

## 2020-02-01 MED ORDER — NITROGLYCERIN 2 % TD OINT
TOPICAL_OINTMENT | TRANSDERMAL | Status: AC
  Filled 2020-02-01: qty 30

## 2020-02-01 MED ORDER — SODIUM CHLORIDE 0.9 % IV SOLN: 250.0000 mL | INTRAVENOUS | Status: DC | PRN

## 2020-02-01 MED ORDER — SUCCINYLCHOLINE CHLORIDE 200 MG/10ML IV SOSY
PREFILLED_SYRINGE | INTRAVENOUS | Status: AC
  Filled 2020-02-01: qty 10

## 2020-02-01 MED ORDER — PROPOFOL 10 MG/ML IV BOLUS
INTRAVENOUS | Status: DC | PRN
  Administered 2020-02-01: 200 mg via INTRAVENOUS

## 2020-02-01 MED ORDER — FENTANYL CITRATE (PF) 100 MCG/2ML IJ SOLN
INTRAMUSCULAR | Status: DC | PRN
  Administered 2020-02-01: 100 ug via INTRAVENOUS
  Administered 2020-02-01 (×4): 50 ug via INTRAVENOUS

## 2020-02-01 MED ORDER — NITROGLYCERIN 2 % TD OINT: 0.5000 [in_us] | TOPICAL_OINTMENT | Freq: Three times a day (TID) | TRANSDERMAL | 0 refills | Status: DC

## 2020-02-01 MED ORDER — ROCURONIUM BROMIDE 10 MG/ML (PF) SYRINGE
PREFILLED_SYRINGE | INTRAVENOUS | Status: AC
  Filled 2020-02-01: qty 10

## 2020-02-01 MED ORDER — PROPOFOL 500 MG/50ML IV EMUL
INTRAVENOUS | Status: DC | PRN
  Administered 2020-02-01: 15 ug/kg/min via INTRAVENOUS

## 2020-02-01 MED ORDER — LIDOCAINE-EPINEPHRINE 1 %-1:100000 IJ SOLN
INTRAMUSCULAR | Status: AC
  Filled 2020-02-01: qty 1

## 2020-02-01 MED ORDER — SODIUM CHLORIDE 0.9 % IV SOLN
INTRAVENOUS | Status: AC
  Filled 2020-02-01: qty 500000

## 2020-02-01 MED ORDER — SODIUM CHLORIDE 0.9 % IV SOLN
INTRAVENOUS | Status: DC | PRN
  Administered 2020-02-01: 500 mL

## 2020-02-01 MED ORDER — MIDAZOLAM HCL 5 MG/5ML IJ SOLN
INTRAMUSCULAR | Status: DC | PRN
  Administered 2020-02-01: 2 mg via INTRAVENOUS

## 2020-02-01 MED ORDER — EPINEPHRINE PF 1 MG/ML IJ SOLN
INTRAMUSCULAR | Status: DC | PRN
  Administered 2020-02-01: 1 mg

## 2020-02-01 MED ORDER — LIDOCAINE HCL (CARDIAC) PF 100 MG/5ML IV SOSY
PREFILLED_SYRINGE | INTRAVENOUS | Status: DC | PRN
  Administered 2020-02-01: 80 mg via INTRAVENOUS

## 2020-02-01 MED ORDER — PROPOFOL 10 MG/ML IV BOLUS
INTRAVENOUS | Status: AC
  Filled 2020-02-01: qty 20

## 2020-02-01 MED ORDER — CEFAZOLIN SODIUM-DEXTROSE 2-4 GM/100ML-% IV SOLN
INTRAVENOUS | Status: AC
  Filled 2020-02-01: qty 100

## 2020-02-01 MED ORDER — EPHEDRINE SULFATE 50 MG/ML IJ SOLN
INTRAMUSCULAR | Status: DC | PRN
  Administered 2020-02-01: 10 mg via INTRAVENOUS
  Administered 2020-02-01: 5 mg via INTRAVENOUS
  Administered 2020-02-01: 10 mg via INTRAVENOUS
  Administered 2020-02-01: 5 mg via INTRAVENOUS
  Administered 2020-02-01: 10 mg via INTRAVENOUS

## 2020-02-01 MED ORDER — LIDOCAINE HCL 1 % IJ SOLN
INTRAMUSCULAR | Status: DC | PRN
  Administered 2020-02-01: 30 mL

## 2020-02-01 MED ORDER — HYDROCODONE-ACETAMINOPHEN 5-325 MG PO TABS: 1.0000 | ORAL_TABLET | Freq: Once | ORAL | Status: DC | PRN

## 2020-02-01 MED ORDER — MIDAZOLAM HCL 2 MG/2ML IJ SOLN
INTRAMUSCULAR | Status: AC
  Filled 2020-02-01: qty 2

## 2020-02-01 MED ORDER — SODIUM CHLORIDE 0.9% FLUSH: 3.0000 mL | Freq: Two times a day (BID) | INTRAVENOUS | Status: DC

## 2020-02-01 MED ORDER — ACETAMINOPHEN 650 MG RE SUPP: 650.0000 mg | RECTAL | Status: DC | PRN

## 2020-02-01 MED ORDER — PROMETHAZINE HCL 25 MG/ML IJ SOLN
6.2500 mg | INTRAMUSCULAR | Status: DC | PRN
  Administered 2020-02-01: 6.25 mg via INTRAVENOUS

## 2020-02-01 MED ORDER — PROPOFOL 500 MG/50ML IV EMUL
INTRAVENOUS | Status: AC
  Filled 2020-02-01: qty 50

## 2020-02-01 MED ORDER — ONDANSETRON HCL 4 MG/2ML IJ SOLN
INTRAMUSCULAR | Status: DC | PRN
  Administered 2020-02-01: 4 mg via INTRAVENOUS

## 2020-02-01 SURGICAL SUPPLY — 59 items
BAG DECANTER FOR FLEXI CONT (MISCELLANEOUS) ×2 IMPLANT
BINDER BREAST 3XL (GAUZE/BANDAGES/DRESSINGS) ×2 IMPLANT
BIOPATCH RED 1 DISK 7.0 (GAUZE/BANDAGES/DRESSINGS) IMPLANT
BLADE HEX COATED 2.75 (ELECTRODE) ×2 IMPLANT
BLADE KNIFE PERSONA 10 (BLADE) ×10 IMPLANT
BLADE SURG 15 STRL LF DISP TIS (BLADE) ×1 IMPLANT
BLADE SURG 15 STRL SS (BLADE) ×1
CANISTER SUCT 1200ML W/VALVE (MISCELLANEOUS) ×2 IMPLANT
COVER BACK TABLE 60X90IN (DRAPES) ×2 IMPLANT
COVER MAYO STAND STRL (DRAPES) ×2 IMPLANT
COVER WAND RF STERILE (DRAPES) IMPLANT
DECANTER SPIKE VIAL GLASS SM (MISCELLANEOUS) IMPLANT
DERMABOND ADVANCED (GAUZE/BANDAGES/DRESSINGS) ×4
DERMABOND ADVANCED .7 DNX12 (GAUZE/BANDAGES/DRESSINGS) ×4 IMPLANT
DRAIN CHANNEL 19F RND (DRAIN) IMPLANT
DRAPE LAPAROSCOPIC ABDOMINAL (DRAPES) ×2 IMPLANT
DRSG OPSITE POSTOP 4X6 (GAUZE/BANDAGES/DRESSINGS) ×4 IMPLANT
DRSG PAD ABDOMINAL 8X10 ST (GAUZE/BANDAGES/DRESSINGS) ×8 IMPLANT
ELECT BLADE 4.0 EZ CLEAN MEGAD (MISCELLANEOUS) ×2
ELECT REM PT RETURN 9FT ADLT (ELECTROSURGICAL) ×2
ELECTRODE BLDE 4.0 EZ CLN MEGD (MISCELLANEOUS) ×1 IMPLANT
ELECTRODE REM PT RTRN 9FT ADLT (ELECTROSURGICAL) ×1 IMPLANT
EVACUATOR SILICONE 100CC (DRAIN) IMPLANT
GAUZE SPONGE 4X4 12PLY STRL LF (GAUZE/BANDAGES/DRESSINGS) IMPLANT
GLOVE BIO SURGEON STRL SZ 6.5 (GLOVE) ×10 IMPLANT
GLOVE BIO SURGEON STRL SZ7 (GLOVE) IMPLANT
GOWN STRL REUS W/ TWL LRG LVL3 (GOWN DISPOSABLE) ×3 IMPLANT
GOWN STRL REUS W/TWL LRG LVL3 (GOWN DISPOSABLE) ×3
NDL SAFETY ECLIPSE 18X1.5 (NEEDLE) IMPLANT
NEEDLE HYPO 18GX1.5 SHARP (NEEDLE)
NEEDLE HYPO 25X1 1.5 SAFETY (NEEDLE) ×2 IMPLANT
NS IRRIG 1000ML POUR BTL (IV SOLUTION) ×2 IMPLANT
PACK BASIN DAY SURGERY FS (CUSTOM PROCEDURE TRAY) ×2 IMPLANT
PAD ALCOHOL SWAB (MISCELLANEOUS) IMPLANT
PENCIL SMOKE EVACUATOR (MISCELLANEOUS) ×2 IMPLANT
PIN SAFETY STERILE (MISCELLANEOUS) IMPLANT
SLEEVE SCD COMPRESS KNEE MED (MISCELLANEOUS) ×2 IMPLANT
SPONGE LAP 18X18 RF (DISPOSABLE) ×6 IMPLANT
STRIP SUTURE WOUND CLOSURE 1/2 (MISCELLANEOUS) ×4 IMPLANT
SUT MNCRL AB 4-0 PS2 18 (SUTURE) ×30 IMPLANT
SUT MON AB 3-0 SH 27 (SUTURE) ×8
SUT MON AB 3-0 SH27 (SUTURE) ×8 IMPLANT
SUT MON AB 5-0 PS2 18 (SUTURE) ×16 IMPLANT
SUT PDS 3-0 CT2 (SUTURE)
SUT PDS AB 2-0 CT2 27 (SUTURE) IMPLANT
SUT PDS II 3-0 CT2 27 ABS (SUTURE) IMPLANT
SUT SILK 3 0 PS 1 (SUTURE) IMPLANT
SYR 3ML 23GX1 SAFETY (SYRINGE) IMPLANT
SYR 50ML LL SCALE MARK (SYRINGE) ×4 IMPLANT
SYR BULB IRRIGATION 50ML (SYRINGE) ×2 IMPLANT
SYR CONTROL 10ML LL (SYRINGE) ×2 IMPLANT
TAPE MEASURE VINYL STERILE (MISCELLANEOUS) IMPLANT
TOWEL GREEN STERILE FF (TOWEL DISPOSABLE) ×4 IMPLANT
TRAY DSU PREP LF (CUSTOM PROCEDURE TRAY) ×2 IMPLANT
TUBE CONNECTING 20X1/4 (TUBING) ×2 IMPLANT
TUBING INFILTRATION IT-10001 (TUBING) IMPLANT
TUBING SET GRADUATE ASPIR 12FT (MISCELLANEOUS) IMPLANT
UNDERPAD 30X36 HEAVY ABSORB (UNDERPADS AND DIAPERS) ×4 IMPLANT
YANKAUER SUCT BULB TIP NO VENT (SUCTIONS) ×2 IMPLANT

## 2020-02-01 NOTE — Op Note (Signed)
Breast Reduction Op note:    DATE OF PROCEDURE: 02/01/2020  LOCATION: [place]  SURGEON: Lyndee Leo Sanger Khristian Phillippi, DO  ASSISTANT: Phoebe Sharps, PA  PREOPERATIVE DIAGNOSIS 1. Macromastia 2. Neck Pain 3. Back Pain  POSTOPERATIVE DIAGNOSIS 1. Macromastia 2. Neck Pain 3. Back Pain  PROCEDURES 1. Bilateral breast reduction.  Right reduction 1185 g, Left reduction 3614 g  COMPLICATIONS: None.  DRAINS: no  INDICATIONS FOR PROCEDURE Amy Pope is a 43 y.o. year-old female born on 1976-01-02,with a history of symptomatic macromastia with concominant back pain, neck pain, shoulder grooving from her bra.   MRN: [id number]  CONSENT Informed consent was obtained directly from the patient. The risks, benefits and alternatives were fully discussed. Specific risks including but not limited to bleeding, infection, hematoma, seroma, scarring, pain, nipple necrosis, asymmetry, poor cosmetic results, and need for further surgery were discussed. The patient had ample opportunity to have her questions answered to her satisfaction.   DESCRIPTION OF PROCEDURE  Patient was brought into the operating room and placed in a supine position.  SCDs were placed and appropriate padding was performed.  Antibiotics were given. The patient underwent general anesthesia and the chest was prepped and draped in a sterile fashion.  A timeout was performed and all information was confirmed to be correct. Tumescent was placed in the lateral aspect of the breasts and liposuction done on each laterally.  Right side: Preoperative markings were confirmed.  Incision lines were injected with 1% Xylocaine with epinephrine.  After waiting for vasoconstriction, the marked lines were incised.  A Wise-pattern superomedial breast reduction was performed by de-epithelializing the pedicle, using bovie to create the superomedial pedicle, and removing breast tissue from the superior, lateral, and inferior  portions of the breast.  Care was taken to not undermine the breast pedicle. Hemostasis was achieved.  The nipple was gently rotated into position and the soft tissue closed with 4-0 Monocryl.   The pocket was irrigated and hemostasis confirmed.  The deep tissues were approximated with 3-0 Monocryl sutures and the skin was closed with deep dermal and subcuticular 4-0 Monocryl sutures.  The nipple and skin flaps had good capillary refill at the end of the procedure.    Left side: Preoperative markings were confirmed.  Incision lines were injected with 1% Xylocaine with epinephrine.  After waiting for vasoconstriction, the marked lines were incised.  A Wise-pattern superomedial breast reduction was performed by de-epithelializing the pedicle, using bovie to create the superomedial pedicle, and removing breast tissue from the superior, lateral, and inferior portions of the breast.  Care was taken to not undermine the breast pedicle. Hemostasis was achieved.  The nipple was gently rotated into position and the soft tissue was closed with 4-0 Monocryl.  The patient was sat upright and size and shape symmetry was confirmed.  The pocket was irrigated and hemostasis confirmed.  The deep tissues were approximated with 3-0 Monocryl sutures and the skin was closed with deep dermal and subcuticular 4-0 Monocryl sutures.  Dermabond was applied.  A breast binder and ABDs were placed.  The nipple and skin flaps had good capillary refill at the end of the procedure.  The patient tolerated the procedure well. The patient was allowed to wake from anesthesia and taken to the recovery room in satisfactory condition  The advanced practice practitioner (APP) assisted throughout the case.  The APP was essential in retraction and counter traction when needed to make the case progress smoothly.  This retraction and assistance  made it possible to see the tissue plans for the procedure.  The assistance was needed for blood control, tissue  re-approximation and assisted with closure of the incision site.  The [place] was signed into law in 2016 which includes the topic of electronic health records.  This provides immediate access to information in MyChart.  This includes consultation notes, operative notes, office notes, lab results and pathology reports.  If you have any questions about what you read please let us know at your next visit or call us at the office.  We are right here with you.

## 2020-02-01 NOTE — Transfer of Care (Signed)
Immediate Anesthesia Transfer of Care Note  Patient: Amy Pope  Procedure(s) Performed: BILATERAL MAMMARY REDUCTION  (BREAST) WITH LIPOSUCTION (Bilateral Breast)  Patient Location: PACU  Anesthesia Type:General  Level of Consciousness: awake, alert  and oriented  Airway & Oxygen Therapy: Patient Spontanous Breathing and Patient connected to face mask oxygen  Post-op Assessment: Report given to RN and Post -op Vital signs reviewed and stable  Post vital signs: Reviewed and stable  Last Vitals:  Vitals Value Taken Time  BP    Temp    Pulse 112 02/01/20 1124  Resp 13 02/01/20 1124  SpO2 98 % 02/01/20 1124  Vitals shown include unvalidated device data.  Last Pain:  Vitals:   02/01/20 0647  TempSrc: Oral  PainSc: 3       Patients Stated Pain Goal: 3 (22/02/54 2706)  Complications: No apparent anesthesia complications

## 2020-02-01 NOTE — Interval H&P Note (Signed)
History and Physical Interval Note:  02/01/2020 7:33 AM  Amy Pope  has presented today for surgery, with the diagnosis of mammary hypertrophy.  The various methods of treatment have been discussed with the patient and family. After consideration of risks, benefits and other options for treatment, the patient has consented to  Procedure(s) with comments: MAMMARY REDUCTION  (BREAST) (Bilateral) - 3.5 hours, please as a surgical intervention.  The patient's history has been reviewed, patient examined, no change in status, stable for surgery.  I have reviewed the patient's chart and labs.  Questions were answered to the patient's satisfaction.     Loel Lofty Sallyanne Birkhead

## 2020-02-01 NOTE — Anesthesia Preprocedure Evaluation (Addendum)
Anesthesia Evaluation  Patient identified by MRN, date of birth, ID band Patient awake    Reviewed: Allergy & Precautions, NPO status , Patient's Chart, lab work & pertinent test results  History of Anesthesia Complications Negative for: history of anesthetic complications  Airway Mallampati: II  TM Distance: >3 FB Neck ROM: Full    Dental  (+) Dental Advisory Given   Pulmonary asthma , former smoker,    Pulmonary exam normal        Cardiovascular negative cardio ROS Normal cardiovascular exam     Neuro/Psych Seizures - (last one ~25-30 years ago), Well Controlled,  negative psych ROS   GI/Hepatic Neg liver ROS, GERD  Medicated and Controlled,  Endo/Other   Obesity   Renal/GU negative Renal ROS     Musculoskeletal  Chronic back pain    Abdominal   Peds  Hematology negative hematology ROS (+)   Anesthesia Other Findings Covid neg 01/29/20   Reproductive/Obstetrics                            Anesthesia Physical Anesthesia Plan  ASA: II  Anesthesia Plan: General   Post-op Pain Management:    Induction: Intravenous  PONV Risk Score and Plan: 4 or greater and Treatment may vary due to age or medical condition, Ondansetron, Midazolam, Dexamethasone and Scopolamine patch - Pre-op  Airway Management Planned: Oral ETT  Additional Equipment: None  Intra-op Plan:   Post-operative Plan: Extubation in OR  Informed Consent: I have reviewed the patients History and Physical, chart, labs and discussed the procedure including the risks, benefits and alternatives for the proposed anesthesia with the patient or authorized representative who has indicated his/her understanding and acceptance.     Dental advisory given  Plan Discussed with: CRNA and Anesthesiologist  Anesthesia Plan Comments:        Anesthesia Quick Evaluation

## 2020-02-01 NOTE — Anesthesia Procedure Notes (Signed)
Procedure Name: Intubation Date/Time: 02/01/2020 7:48 AM Performed by: Willa Frater, CRNA Pre-anesthesia Checklist: Patient identified, Emergency Drugs available, Suction available and Patient being monitored Patient Re-evaluated:Patient Re-evaluated prior to induction Oxygen Delivery Method: Circle system utilized Preoxygenation: Pre-oxygenation with 100% oxygen Induction Type: IV induction Ventilation: Mask ventilation without difficulty Laryngoscope Size: Mac and 3 Grade View: Grade I Tube type: Oral Tube size: 7.0 mm Number of attempts: 1 Airway Equipment and Method: Stylet and Oral airway Placement Confirmation: ETT inserted through vocal cords under direct vision,  positive ETCO2 and breath sounds checked- equal and bilateral Secured at: 23 cm Tube secured with: Tape Dental Injury: Teeth and Oropharynx as per pre-operative assessment

## 2020-02-01 NOTE — Anesthesia Postprocedure Evaluation (Signed)
Anesthesia Post Note  Patient: Amy Pope  Procedure(s) Performed: BILATERAL MAMMARY REDUCTION  (BREAST) WITH LIPOSUCTION (Bilateral Breast)     Patient location during evaluation: PACU Anesthesia Type: General Level of consciousness: awake and alert Pain management: pain level controlled Vital Signs Assessment: post-procedure vital signs reviewed and stable Respiratory status: spontaneous breathing, nonlabored ventilation, respiratory function stable and patient connected to nasal cannula oxygen Cardiovascular status: blood pressure returned to baseline and stable Postop Assessment: no apparent nausea or vomiting (nausea improved with medication) Anesthetic complications: no    Last Vitals:  Vitals:   02/01/20 1215 02/01/20 1230  BP: (!) 113/48 (!) (P) 111/59  Pulse: 91 85  Resp: 19 14  Temp:    SpO2: 99% 97%    Last Pain:  Vitals:   02/01/20 1230  TempSrc:   PainSc: [place] Keiarah Orlowski

## 2020-02-01 NOTE — Discharge Instructions (Signed)
Post Anesthesia Home Care Instructions  Activity: Get plenty of rest for the remainder of the day. A responsible individual must stay with you for 24 hours following the procedure.  For the next 24 hours, DO NOT: -Drive a car -Paediatric nurse -Drink alcoholic beverages -Take any medication unless instructed by your physician -Make any legal decisions or sign important papers.  Meals: Start with liquid foods such as gelatin or soup. Progress to regular foods as tolerated. Avoid greasy, spicy, heavy foods. If nausea and/or vomiting occur, drink only clear liquids until the nausea and/or vomiting subsides. Call your physician if vomiting continues.  Special Instructions/Symptoms: Your throat may feel dry or sore from the anesthesia or the breathing tube placed in your throat during surgery. If this causes discomfort, gargle with warm salt water. The discomfort should disappear within 24 hours.  If you had a scopolamine patch placed behind your ear for the management of post- operative nausea and/or vomiting:  1. The medication in the patch is effective for 72 hours, after which it should be removed.  Wrap patch in a tissue and discard in the trash. Wash hands thoroughly with soap and water. 2. You may remove the patch earlier than 72 hours if you experience unpleasant side effects which may include dry mouth, dizziness or visual disturbances. 3. Avoid touching the patch. Wash your hands with soap and water after contact with the patch.    INSTRUCTIONS FOR AFTER SURGERY   You will likely have some questions about what to expect following your operation.  The following information will help you and your family understand what to expect when you are discharged from the hospital.  Following these guidelines will help ensure a smooth recovery and reduce risks of complications.  Postoperative instructions include information on: diet, wound care, medications and physical activity.  AFTER  SURGERY Expect to go home after the procedure.  In some cases, you may need to spend one night in the hospital for observation.  DIET This surgery does not require a specific diet.  However, I have to mention that the healthier you eat the better your body can start healing. It is important to increasing your protein intake.  This means limiting the foods with added sugar.  Focus on fruits and vegetables and some meat.  If you have any liposuction during your procedure be sure to drink water.  If your urine is bright yellow, then it is concentrated, and you need to drink more water.  As a general rule after surgery, you should have 8 ounces of water every hour while awake.  If you find you are persistently nauseated or unable to take in liquids let us know.  NO TOBACCO USE or EXPOSURE.  This will slow your healing process and increase the risk of a wound.  WOUND CARE If you don't have a drain: You can shower the day after surgery.  Use fragrance free soap.  Dial, [place], [place] and Cetaphil are usually mild on the skin.  If you have steri-strips / tape directly attached to your skin leave them in place. It is OK to get these wet.  No baths, pools or hot tubs for two weeks. We close your incision to leave the smallest and best-looking scar. No ointment or creams on your incisions until given the go ahead.  Especially not Neosporin (Too many skin reactions with this one).  A few weeks after surgery you can use Mederma and start massaging the scar. We ask you to  wear your binder or sports bra for the first 6 weeks around the clock, including while sleeping. This provides added comfort and helps reduce the fluid accumulation at the surgery site.  ACTIVITY No heavy lifting until cleared by the doctor.  It is OK to walk and climb stairs. In fact, moving your legs is very important to decrease your risk of a blood clot.  It will also help keep you from getting deconditioned.  Every 1 to 2 hours get up and walk  for 5 minutes. This will help with a quicker recovery back to normal.  Let pain be your guide so you don't do too much.  NO, you cannot do the spring cleaning and don't plan on taking care of anyone else.  This is your time for TLC.   WORK Everyone returns to work at different times. As a rough guide, most people take at least 1 - 2 weeks off prior to returning to work. If you need documentation for your job, bring the forms to your postoperative follow up visit.  DRIVING Arrange for someone to bring you home from the hospital.  You may be able to drive a few days after surgery but not while taking any narcotics or valium.  BOWEL MOVEMENTS Constipation can occur after anesthesia and while taking pain medication.  It is important to stay ahead for your comfort.  We recommend taking Milk of Magnesia (2 tablespoons; twice a day) while taking the pain pills.  SEROMA This is fluid your body tried to put in the surgical site.  This is normal but if it creates excessive pain and swelling let us know.  It usually decreases in a few weeks.  MEDICATIONS and PAIN CONTROL At your preoperative visit for you history and physical you were given the following medications: 1. An antibiotic: Start this medication when you get home and take according to the instructions on the bottle. 2. Zofran 4 mg:  This is to treat nausea and vomiting.  You can take this every 6 hours as needed and only if needed. 3. Norco (hydrocodone/acetaminophen) 5/325 mg:  This is only to be used after you have taken the motrin or the tylenol. Every 8 hours as needed. Over the counter Medication to take: 4. Ibuprofen (Motrin) 600 mg:  Take this every 6 hours.  If you have additional pain then take 500 mg of the tylenol.  Only take the Norco after you have tried these two. 5. Miralax or stool softener of choice: Take this according to the bottle if you take the [place] Call your surgeon's office if any of the following  occur: . Fever 101 degrees F or greater . Excessive bleeding or fluid from the incision site. . Pain that increases over time without aid from the medications . Redness, warmth, or pus draining from incision sites . Persistent nausea or inability to take in liquids . Severe misshapen area that underwent the operation.  Apply Nitro paste to bilateral nipples/ areola's every 8 hours for the FIRST 2 DAYS ONLY.

## 2020-02-02 ENCOUNTER — Encounter: Payer: Self-pay | Admitting: Plastic Surgery

## 2020-02-05 LAB — SURGICAL PATHOLOGY

## 2020-02-09 ENCOUNTER — Other Ambulatory Visit: Payer: Self-pay

## 2020-02-09 ENCOUNTER — Encounter: Payer: Self-pay | Admitting: Plastic Surgery

## 2020-02-09 ENCOUNTER — Ambulatory Visit (INDEPENDENT_AMBULATORY_CARE_PROVIDER_SITE_OTHER): Payer: BC Managed Care – PPO | Admitting: Plastic Surgery

## 2020-02-09 VITALS — BP 118/85 | HR 78 | Temp 97.7°F | Ht 69.0 in | Wt 230.2 lb

## 2020-02-09 DIAGNOSIS — N62 Hypertrophy of breast: Secondary | ICD-10-CM

## 2020-02-09 NOTE — Progress Notes (Signed)
The patient is a 44 year old female here for follow-up after undergoing bilateral breast reduction.  She is very pleased with her results.  She has bruising and swelling as expected.  No sign of infection. No sign of hematoma or seroma. The NAC are intact and appear to be viable.  She has been placing vaseline to them daily.  She can shower and go into a sports bra.

## 2020-02-14 ENCOUNTER — Ambulatory Visit (INDEPENDENT_AMBULATORY_CARE_PROVIDER_SITE_OTHER): Payer: BC Managed Care – PPO

## 2020-02-14 DIAGNOSIS — J309 Allergic rhinitis, unspecified: Secondary | ICD-10-CM

## 2020-02-19 ENCOUNTER — Ambulatory Visit (INDEPENDENT_AMBULATORY_CARE_PROVIDER_SITE_OTHER): Payer: BC Managed Care – PPO

## 2020-02-19 DIAGNOSIS — J309 Allergic rhinitis, unspecified: Secondary | ICD-10-CM

## 2020-02-22 NOTE — Progress Notes (Signed)
Patient is a 44 year old female here for follow-up after undergoing bilateral breast reduction (right reduction 1185 g, left reduction 1386 g) on 02/01/2020 with Dr. Marla Roe.  She presents today with her husband. Overall she reports she's doing well. Reports some swelling on the right side. Concerned about the shape of the right NAC. Denies fever, CP, SOB, N/V. Reports pain well controlled. Incisions are healing well, c/d/i. Steristrips and some dermabond is still present.  No signs of infection, redness, drainage.  Right side is a little more swollen than the left. 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  Continue wearing sports bra or binder 24/7 until 6 weeks postop.  Reminded patient to avoid heavy lifting (>15 lbs) for 3 more weeks.  She may walk for exercise but avoid upper body activities for 3 more weeks.  NAC will take some time to settle out to its final shape.  May moisturize breast incisions with Vaseline or Aquaphor.  Follow-up in 2 to 3 weeks.  Call office with any questions/concerns or if swelling worsens.  The [place] was signed into law in 2016 which includes the topic of electronic health records.  This provides immediate access to information in MyChart.  This includes consultation notes, operative notes, office notes, lab results and pathology reports.  If you have any questions about what you read please let us know at your next visit or call us at the office.  We are right here with you.

## 2020-02-23 ENCOUNTER — Ambulatory Visit (INDEPENDENT_AMBULATORY_CARE_PROVIDER_SITE_OTHER): Payer: BC Managed Care – PPO | Admitting: Plastic Surgery

## 2020-02-23 ENCOUNTER — Encounter: Payer: Self-pay | Admitting: Plastic Surgery

## 2020-02-23 ENCOUNTER — Other Ambulatory Visit: Payer: Self-pay

## 2020-02-23 DIAGNOSIS — Z9889 Other specified postprocedural states: Secondary | ICD-10-CM

## 2020-02-29 ENCOUNTER — Ambulatory Visit (INDEPENDENT_AMBULATORY_CARE_PROVIDER_SITE_OTHER): Payer: BC Managed Care – PPO

## 2020-02-29 DIAGNOSIS — J309 Allergic rhinitis, unspecified: Secondary | ICD-10-CM | POA: Diagnosis not present

## 2020-03-15 ENCOUNTER — Ambulatory Visit (INDEPENDENT_AMBULATORY_CARE_PROVIDER_SITE_OTHER): Payer: BC Managed Care – PPO

## 2020-03-15 DIAGNOSIS — J309 Allergic rhinitis, unspecified: Secondary | ICD-10-CM | POA: Diagnosis not present

## 2020-03-19 NOTE — Progress Notes (Signed)
Patient is a 44 year old female here for follow-up after undergoing bilateral breast reduction (right reduction 1185 g, left reduction 1386 g) on 02/01/2020 with Dr. Marla Roe. ~7 weeks PO  She reports overall she is doing well today.  Reports some lingering pain and tightness on the right >> left.  She is a Air traffic controller and right-handed.  Lateral side of right breast under the axilla region has some firmness and swelling.  Incisions are healing very well bilaterally, C/D/I.  No signs of infection, redness, drainage, seroma/hematoma.  May begin applying Mederma or silicone to scars.  Continue doing circular massage along the right lateral breast along area of firmness.  Referral sent to physical therapy.  Pictures were obtained of the patient and placed in the chart with the patient's or guardian's permission.  Follow-up in 5 weeks at 3 months postop with Dr. Marla Roe.  Call office with any questions/concerns.  The [place] was signed into law in 2016 which includes the topic of electronic health records.  This provides immediate access to information in MyChart.  This includes consultation notes, operative notes, office notes, lab results and pathology reports.  If you have any questions about what you read please let us know at your next visit or call us at the office.  We are right here with you.

## 2020-03-20 ENCOUNTER — Encounter: Payer: Self-pay | Admitting: Plastic Surgery

## 2020-03-20 ENCOUNTER — Ambulatory Visit (INDEPENDENT_AMBULATORY_CARE_PROVIDER_SITE_OTHER): Payer: BC Managed Care – PPO | Admitting: Plastic Surgery

## 2020-03-20 ENCOUNTER — Other Ambulatory Visit: Payer: Self-pay

## 2020-03-20 VITALS — BP 121/78 | HR 84 | Temp 97.7°F | Ht 69.0 in | Wt 238.0 lb

## 2020-03-20 DIAGNOSIS — Z9889 Other specified postprocedural states: Secondary | ICD-10-CM

## 2020-03-22 ENCOUNTER — Ambulatory Visit (INDEPENDENT_AMBULATORY_CARE_PROVIDER_SITE_OTHER): Payer: BC Managed Care – PPO | Admitting: *Deleted

## 2020-03-22 DIAGNOSIS — J309 Allergic rhinitis, unspecified: Secondary | ICD-10-CM | POA: Diagnosis not present

## 2020-03-29 ENCOUNTER — Ambulatory Visit (INDEPENDENT_AMBULATORY_CARE_PROVIDER_SITE_OTHER): Payer: BC Managed Care – PPO

## 2020-03-29 DIAGNOSIS — J309 Allergic rhinitis, unspecified: Secondary | ICD-10-CM

## 2020-04-01 ENCOUNTER — Telehealth: Payer: Self-pay | Admitting: *Deleted

## 2020-04-01 NOTE — Telephone Encounter (Signed)
Received Plan of Care via of fax from [facility] on (03/29/20) requesting signature and to be returned.    Plan of Care signed and faxed to [facility].  Confirmation received, and copy scanned into the chart.//AB/CMA

## 2020-04-05 ENCOUNTER — Ambulatory Visit (INDEPENDENT_AMBULATORY_CARE_PROVIDER_SITE_OTHER): Payer: BC Managed Care – PPO

## 2020-04-05 DIAGNOSIS — J309 Allergic rhinitis, unspecified: Secondary | ICD-10-CM | POA: Diagnosis not present

## 2020-04-17 ENCOUNTER — Ambulatory Visit (INDEPENDENT_AMBULATORY_CARE_PROVIDER_SITE_OTHER): Payer: BC Managed Care – PPO | Admitting: *Deleted

## 2020-04-17 DIAGNOSIS — J309 Allergic rhinitis, unspecified: Secondary | ICD-10-CM

## 2020-04-23 ENCOUNTER — Ambulatory Visit (INDEPENDENT_AMBULATORY_CARE_PROVIDER_SITE_OTHER): Payer: BC Managed Care – PPO

## 2020-04-23 DIAGNOSIS — J309 Allergic rhinitis, unspecified: Secondary | ICD-10-CM

## 2020-04-30 ENCOUNTER — Encounter: Payer: Self-pay | Admitting: Plastic Surgery

## 2020-04-30 ENCOUNTER — Ambulatory Visit (INDEPENDENT_AMBULATORY_CARE_PROVIDER_SITE_OTHER): Payer: BC Managed Care – PPO | Admitting: Plastic Surgery

## 2020-04-30 ENCOUNTER — Other Ambulatory Visit: Payer: Self-pay

## 2020-04-30 VITALS — BP 114/81 | HR 71 | Temp 97.8°F | Ht 69.0 in | Wt 228.8 lb

## 2020-04-30 DIAGNOSIS — N62 Hypertrophy of breast: Secondary | ICD-10-CM

## 2020-04-30 NOTE — Progress Notes (Signed)
   Subjective:    Patient ID: Amy Pope, female    DOB: February 05, 1976, 44 y.o.   MRN: [id number]  Patient is a 44 year old female here for follow-up after undergoing her breast reduction bilaterally.  She has also finished with physical therapy.  She is very pleased with her progress.  There is no sign of seroma or hematoma.  The incisions are all healing very nicely.  She is using Mederma and I think that that is probably helping.  She has a odd discoloration on the right lateral breast area where the honeycomb dressing was I would like to keep an eye on this.     Review of Systems  Constitutional: Negative.   HENT: Negative.   Eyes: Negative.   Respiratory: Negative.   Cardiovascular: Negative.   Gastrointestinal: Negative.   Endocrine: Negative.   Genitourinary: Negative.   Hematological: Negative.   Psychiatric/Behavioral: Negative.        Objective:   Physical Exam Vitals and nursing note reviewed.  Constitutional:      Appearance: Normal appearance.  HENT:     Head: Normocephalic and atraumatic.  Cardiovascular:     Rate and Rhythm: Normal rate.     Pulses: Normal pulses.  Pulmonary:     Effort: Pulmonary effort is normal.  Skin:    General: Skin is warm.  Neurological:     General: No focal deficit present.     Mental Status: She is alert and oriented to person, place, and time.  Psychiatric:        Mood and Affect: Mood normal.        Behavior: Behavior normal.       Assessment & Plan:     ICD-10-CM   1. Symptomatic mammary hypertrophy  N62     Plan on telemetry visit in 1 month to double check on the discoloration of the right breast.  Pictures were obtained of the patient and placed in the chart with the patient's or guardian's permission.

## 2020-05-02 ENCOUNTER — Ambulatory Visit (INDEPENDENT_AMBULATORY_CARE_PROVIDER_SITE_OTHER): Payer: BC Managed Care – PPO

## 2020-05-02 DIAGNOSIS — J309 Allergic rhinitis, unspecified: Secondary | ICD-10-CM

## 2020-05-08 ENCOUNTER — Ambulatory Visit (INDEPENDENT_AMBULATORY_CARE_PROVIDER_SITE_OTHER): Payer: BC Managed Care – PPO | Admitting: *Deleted

## 2020-05-08 DIAGNOSIS — J309 Allergic rhinitis, unspecified: Secondary | ICD-10-CM

## 2020-05-14 ENCOUNTER — Ambulatory Visit (INDEPENDENT_AMBULATORY_CARE_PROVIDER_SITE_OTHER): Payer: BC Managed Care – PPO

## 2020-05-14 DIAGNOSIS — J309 Allergic rhinitis, unspecified: Secondary | ICD-10-CM

## 2020-05-21 ENCOUNTER — Ambulatory Visit (INDEPENDENT_AMBULATORY_CARE_PROVIDER_SITE_OTHER): Payer: BC Managed Care – PPO | Admitting: *Deleted

## 2020-05-21 DIAGNOSIS — J309 Allergic rhinitis, unspecified: Secondary | ICD-10-CM

## 2020-05-24 ENCOUNTER — Ambulatory Visit: Payer: BC Managed Care – PPO

## 2020-05-28 ENCOUNTER — Ambulatory Visit (INDEPENDENT_AMBULATORY_CARE_PROVIDER_SITE_OTHER): Payer: BC Managed Care – PPO | Admitting: *Deleted

## 2020-05-28 DIAGNOSIS — J309 Allergic rhinitis, unspecified: Secondary | ICD-10-CM | POA: Diagnosis not present

## 2020-06-10 ENCOUNTER — Ambulatory Visit (INDEPENDENT_AMBULATORY_CARE_PROVIDER_SITE_OTHER): Payer: BC Managed Care – PPO

## 2020-06-10 DIAGNOSIS — J309 Allergic rhinitis, unspecified: Secondary | ICD-10-CM | POA: Diagnosis not present

## 2020-06-11 ENCOUNTER — Telehealth (INDEPENDENT_AMBULATORY_CARE_PROVIDER_SITE_OTHER): Payer: BC Managed Care – PPO | Admitting: Plastic Surgery

## 2020-06-11 ENCOUNTER — Encounter: Payer: Self-pay | Admitting: Plastic Surgery

## 2020-06-11 ENCOUNTER — Other Ambulatory Visit: Payer: Self-pay

## 2020-06-11 ENCOUNTER — Telehealth: Payer: BC Managed Care – PPO | Admitting: Plastic Surgery

## 2020-06-11 DIAGNOSIS — N62 Hypertrophy of breast: Secondary | ICD-10-CM

## 2020-06-11 NOTE — Progress Notes (Addendum)
Patient is a 44 year old female joining me by phone.  She underwent bilateral breast reduction February 01, 2020. Overall she is doing very well and pleased with her results.  She had improvement in her upper back pain.  Some of the lower back pain has still been a problem.  She is seeking care for that.  She has no complaints about her breasts.  I would like to see her back when she gets a chance.  Part of the challenge is her job with the way she has to bend over.  She is going to try some methods to see if she can help alleviate the back pain.  The patient gave consent to have this visit done by telemedicine / virtual visit.  This is also consent for access the chart and treat the patient via this visit.  The patient is located at home.  I, the provider, am at the office.  We spent 5 minutes together for the visit.

## 2020-06-12 ENCOUNTER — Other Ambulatory Visit: Payer: Self-pay

## 2020-06-12 ENCOUNTER — Emergency Department
Admission: EM | Admit: 2020-06-12 | Discharge: 2020-06-12 | Disposition: A | Discharge status: Home / Self Care | Payer: BC Managed Care – PPO | Source: Home / Self Care | Attending: Family Medicine | Admitting: Family Medicine

## 2020-06-12 DIAGNOSIS — J069 Acute upper respiratory infection, unspecified: Secondary | ICD-10-CM

## 2020-06-12 LAB — POCT CBC W AUTO DIFF (K'VILLE URGENT CARE)

## 2020-06-12 MED ORDER — DOXYCYCLINE HYCLATE 100 MG PO CAPS: 100.0000 mg | ORAL_CAPSULE | Freq: Two times a day (BID) | ORAL | 0 refills | Status: DC

## 2020-06-12 MED ORDER — PREDNISONE 20 MG PO TABS
ORAL_TABLET | ORAL | 0 refills | Status: DC
Start: 2020-06-12 — End: 2020-09-20

## 2020-06-12 NOTE — ED Triage Notes (Signed)
Pt c/o lower back pain and radiating up into middle back. Pt also reports chest and back tightness. Onset of symptoms 5 days ago. Pt has taken meloxicam and tylenol without improving.

## 2020-06-12 NOTE — Discharge Instructions (Signed)
Take plain guaifenesin (1261m extended release tabs such as Mucinex) twice daily, with plenty of water, for cough and congestion.  May add Pseudoephedrine (338m one or two every 4 to 6 hours) for sinus congestion.  Get adequate rest.   May use Afrin nasal spray (or generic oxymetazoline) each morning for about 5 days and then discontinue.  Also recommend using saline nasal spray several times daily and saline nasal irrigation (AYR is a common brand).  Use Flonase nasal spray each morning after using Afrin nasal spray and saline nasal irrigation. Try warm salt water gargles for sore throat.  Stop all antihistamines for now, and other non-prescription cough/cold preparations. May take Delsym Cough Suppressant at bedtime for nighttime cough.  Begin Doxycycline if not improving about one week or if persistent fever develops. Continue albuterol inhaler as needed.  Isolate yourself until COVID-19 test result is available.   If your COVID19 test is positive, then you are infected with the novel coronavirus and could give the virus to others.  Please continue isolation at home for at least 10 days since the start of your symptoms.  Once you complete your 10 day quarantine, you may return to normal activities as long as you've not had a fever for over 24 hours (without taking fever reducing medicine) and your symptoms are improving. Please continue good preventive care measures, including:  frequent hand-washing, avoid touching your face, cover coughs/sneezes, stay out of crowds and keep a 6 foot distance from others.  Go to the nearest hospital emergency room if fever/cough/breathlessness are severe or illness seems like a threat to life.

## 2020-06-12 NOTE — ED Provider Notes (Signed)
[facility]    CSN: [id number] Arrival date & time: 06/12/20  1447      History   Chief Complaint No chief complaint on file.   HPI Amy Pope is a 44 y.o. female.   Patient reports onset of increased lower back ache about 5 days ago.  Yesterday she developed fatigue, chills, chest congestion with mild cough, and increased sinus congestion.  She has had mild nausea without vomiting, and mild right earache.  She has seasonal asthma but denies recent wheezing or shortness of breath. She has a past history of pneumonia in 2017.  The history is provided by the patient.    Past Medical History:  Diagnosis Date  . Asthma due to seasonal allergies   . Chronic back pain   . Mild intermittent asthma 09/28/2019  . Urticaria     Patient Active Problem List   Diagnosis Date Noted  . S/P bilateral breast reduction 02/23/2020  . Symptomatic mammary hypertrophy 12/05/2019  . Back pain 12/05/2019  . Neck pain 12/05/2019  . Allergic reaction 09/28/2019  . Perennial and seasonal allergic rhinitis 09/28/2019  . Allergic conjunctivitis 09/28/2019  . Allergic urticaria 09/28/2019  . Mild intermittent asthma 09/28/2019    Past Surgical History:  Procedure Laterality Date  . BACK SURGERY    . BREAST REDUCTION SURGERY Bilateral 02/01/2020   Procedure: BILATERAL MAMMARY REDUCTION  (BREAST) WITH LIPOSUCTION;  Surgeon: Wallace Going, DO;  Location: [place];  Service: Plastics;  Laterality: Bilateral;  3.5 hours, please  . CESAREAN SECTION    . ENDOMETRIAL ABLATION    . TUBAL LIGATION      OB History   No obstetric history on file.      Home Medications    Prior to Admission medications   Medication Sig Start Date End Date Taking? Authorizing Provider  albuterol (VENTOLIN HFA) 108 (90 Base) MCG/ACT inhaler Inhale 1-2 puffs into the lungs as needed for wheezing or shortness of breath (every 4-6 hours). 09/28/19   Bobbitt, Sedalia Muta, MD    ALPRAZolam Duanne Moron) 1 MG tablet Take 1 mg by mouth at bedtime as needed for anxiety.    [provider]  Azelastine HCl 0.15 % SOLN Place 1-2 sprays into both nostrils 2 (two) times daily as needed. 09/28/19   Bobbitt, Sedalia Muta, MD  doxycycline (VIBRAMYCIN) 100 MG capsule Take 1 capsule (100 mg total) by mouth 2 (two) times daily. Take with food. 06/12/20   Kandra Nicolas, MD  ibuprofen (ADVIL,MOTRIN) 800 MG tablet Take 800 mg by mouth every 8 (eight) hours as needed (duexis).    [provider]  Ibuprofen-Famotidine (DUEXIS) 800-26.6 MG TABS Take by mouth.    [provider]  levocetirizine (XYZAL) 5 MG tablet Take 1 tablet (5 mg total) by mouth daily. 09/28/19   Bobbitt, Sedalia Muta, MD  meloxicam (MOBIC) 15 MG tablet Take 15 mg by mouth daily.    [provider]  Misc Natural Products (ESTROVEN ENERGY PO) Take by mouth.    [provider]  Misc Natural Products (PRO HERBS IMMUNE DEFENSE PO) Take by mouth.    [provider]  Multiple Vitamin (MULTIVITAMIN) tablet Take 1 tablet by mouth daily.    [provider]  Olopatadine HCl (PAZEO) 0.7 % SOLN Place 1 drop into both eyes 1 day or 1 dose. 09/28/19   Bobbitt, Sedalia Muta, MD  predniSONE (DELTASONE) 20 MG tablet Take one tab by mouth twice daily for 4  days, then one daily. Take with food. 06/12/20   Kandra Nicolas, MD    Family History Family History  Problem Relation Age of Onset  . Urticaria Mother   . Eczema Sister   . Asthma Maternal Aunt   . Allergic rhinitis Neg Hx     Social History Social History   Tobacco Use  . Smoking status: Former Smoker    Types: Cigarettes    Quit date: 01/29/2015    Years since quitting: 5.3  . Smokeless tobacco: Never Used  Vaping Use  . Vaping Use: Never used  Substance Use Topics  . Alcohol use: Not Currently  . Drug use: Not Currently     Allergies   Morphine and Morphine and related   Review of Systems Review of  Systems No sore throat + cough No pleuritic pain No wheezing + nasal congestion + post-nasal drainage + sinus pain/pressure No itchy/red eyes + right earache No hemoptysis No SOB No fever, + chills + nausea No vomiting No abdominal pain No diarrhea No urinary symptoms No skin rash + fatigue + myalgias (back) No headache Used OTC meds (Tylenol) without relief   Physical Exam Triage Vital Signs ED Triage Vitals  Enc Vitals Group     BP 06/12/20 1501 120/73     Pulse Rate 06/12/20 1501 88     Resp 06/12/20 1501 (!) 21     Temp 06/12/20 1501 99.1 F (37.3 C)     Temp Source 06/12/20 1501 Oral     SpO2 06/12/20 1501 98 %     Weight 06/12/20 1459 225 lb (102.1 kg)     Height 06/12/20 1459 5' 9"  (1.753 m)     Head Circumference --      Peak Flow --      Pain Score 06/12/20 1458 6     Pain Loc --      Pain Edu? --      Excl. in [place]? --    No data found.  Updated Vital Signs BP 120/73 (BP Location: Right Arm)   Pulse 88   Temp 99.1 F (37.3 C) (Oral)   Resp (!) 21   Ht 5' 9"  (1.753 m)   Wt 102.1 kg   SpO2 98%   BMI 33.23 kg/m   Visual Acuity Right Eye Distance:   Left Eye Distance:   Bilateral Distance:    Right Eye Near:   Left Eye Near:    Bilateral Near:     Physical Exam Nursing notes and Vital Signs reviewed. Appearance:  Patient appears stated age, and in no acute distress Eyes:  Pupils are equal, round, and reactive to light and accomodation.  Extraocular movement is intact.  Conjunctivae are not inflamed  Ears:  Canals normal.  Tympanic membranes normal.  Nose:  Mildly congested turbinates.  No sinus tenderness.  Pharynx:  Normal Neck:  Supple.  Mildly enlarged lateral nodes are present, tender to palpation on the left.   Lungs:  Clear to auscultation.  Breath sounds are equal.  Moving air well. Heart:  Regular rate and rhythm without murmurs, rubs, or gallops.  Abdomen:  Nontender without masses or hepatosplenomegaly.  Bowel sounds are  present.  No CVA or flank tenderness.  Extremities:  No edema.  Skin:  No rash present.   UC Treatments / Results  Labs (all labs ordered are listed, but only abnormal results are displayed) Labs Reviewed  SARS-COV-2 RNA,(COVID-19) QUALITATIVE NAAT  POCT CBC W AUTO DIFF ([facility]):  WBC 7.9; LY 14.4; MO 7.6;  GR 78.0; Hgb 12.8; Platelets 331     EKG   Radiology No results found.  Procedures Procedures (including critical care time)  Medications Ordered in UC Medications - No data to display  Initial Impression / Assessment and Plan / UC Course  I have reviewed the triage vital signs and the nursing notes.  Pertinent labs & imaging results that were available during my care of the patient were reviewed by me and considered in my medical decision making (see chart for details).    Normal WBC (7.9) reassuring. Suspect typical viral URI, but will check COVID19 send out test. There is no evidence of bacterial infection today.  Begin prednisone burst/taper. Note past history of pneumonia.  Rx for doxycycline to hold. Followup with Family Doctor if not improved in about 10 days.  Final Clinical Impressions(s) / UC Diagnoses   Final diagnoses:  Viral URI with cough     Discharge Instructions     Take plain guaifenesin (1262m extended release tabs such as Mucinex) twice daily, with plenty of water, for cough and congestion.  May add Pseudoephedrine (338m one or two every 4 to 6 hours) for sinus congestion.  Get adequate rest.   May use Afrin nasal spray (or generic oxymetazoline) each morning for about 5 days and then discontinue.  Also recommend using saline nasal spray several times daily and saline nasal irrigation (AYR is a common brand).  Use Flonase nasal spray each morning after using Afrin nasal spray and saline nasal irrigation. Try warm salt water gargles for sore throat.  Stop all antihistamines for now, and other non-prescription cough/cold  preparations. May take Delsym Cough Suppressant at bedtime for nighttime cough.  Begin Doxycycline if not improving about one week or if persistent fever develops (Given a prescription to hold, with an expiration date)     Continue albuterol inhaler as needed.  Isolate yourself until COVID-19 test result is available.   If your COVID19 test is positive, then you are infected with the novel coronavirus and could give the virus to others.  Please continue isolation at home for at least 10 days since the start of your symptoms.  Once you complete your 10 day quarantine, you may return to normal activities as long as you've not had a fever for over 24 hours (without taking fever reducing medicine) and your symptoms are improving. Please continue good preventive care measures, including:  frequent hand-washing, avoid touching your face, cover coughs/sneezes, stay out of crowds and keep a 6 foot distance from others.  Go to the nearest hospital emergency room if fever/cough/breathlessness are severe or illness seems like a threat to life.      ED Prescriptions    Medication Sig Dispense Auth. Provider   predniSONE (DELTASONE) 20 MG tablet Take one tab by mouth twice daily for 4 days, then one daily. Take with food. 12 tablet BeKandra NicolasMD   doxycycline (VIBRAMYCIN) 100 MG capsule Take 1 capsule (100 mg total) by mouth 2 (two) times daily. Take with food. 14 capsule BeKandra NicolasMD        BeKandra NicolasMD 06/14/20 [id number]

## 2020-06-13 LAB — SARS-COV-2 RNA,(COVID-19) QUALITATIVE NAAT: SARS CoV2 RNA: NOT DETECTED

## 2020-06-21 ENCOUNTER — Ambulatory Visit (INDEPENDENT_AMBULATORY_CARE_PROVIDER_SITE_OTHER): Payer: BC Managed Care – PPO

## 2020-06-21 DIAGNOSIS — J309 Allergic rhinitis, unspecified: Secondary | ICD-10-CM | POA: Diagnosis not present

## 2020-06-27 ENCOUNTER — Ambulatory Visit (INDEPENDENT_AMBULATORY_CARE_PROVIDER_SITE_OTHER): Payer: BC Managed Care – PPO

## 2020-06-27 DIAGNOSIS — J309 Allergic rhinitis, unspecified: Secondary | ICD-10-CM

## 2020-07-05 ENCOUNTER — Ambulatory Visit (INDEPENDENT_AMBULATORY_CARE_PROVIDER_SITE_OTHER): Payer: BC Managed Care – PPO

## 2020-07-05 DIAGNOSIS — J309 Allergic rhinitis, unspecified: Secondary | ICD-10-CM | POA: Diagnosis not present

## 2020-07-11 ENCOUNTER — Ambulatory Visit (INDEPENDENT_AMBULATORY_CARE_PROVIDER_SITE_OTHER): Payer: BC Managed Care – PPO

## 2020-07-11 DIAGNOSIS — J309 Allergic rhinitis, unspecified: Secondary | ICD-10-CM

## 2020-07-18 ENCOUNTER — Ambulatory Visit (INDEPENDENT_AMBULATORY_CARE_PROVIDER_SITE_OTHER): Payer: BC Managed Care – PPO

## 2020-07-18 DIAGNOSIS — J309 Allergic rhinitis, unspecified: Secondary | ICD-10-CM

## 2020-07-26 ENCOUNTER — Ambulatory Visit (INDEPENDENT_AMBULATORY_CARE_PROVIDER_SITE_OTHER): Payer: BC Managed Care – PPO

## 2020-07-26 DIAGNOSIS — J309 Allergic rhinitis, unspecified: Secondary | ICD-10-CM | POA: Diagnosis not present

## 2020-08-02 ENCOUNTER — Ambulatory Visit (INDEPENDENT_AMBULATORY_CARE_PROVIDER_SITE_OTHER): Payer: BC Managed Care – PPO | Admitting: *Deleted

## 2020-08-02 DIAGNOSIS — J309 Allergic rhinitis, unspecified: Secondary | ICD-10-CM

## 2020-08-09 ENCOUNTER — Ambulatory Visit (INDEPENDENT_AMBULATORY_CARE_PROVIDER_SITE_OTHER): Payer: BC Managed Care – PPO | Admitting: *Deleted

## 2020-08-09 DIAGNOSIS — J309 Allergic rhinitis, unspecified: Secondary | ICD-10-CM

## 2020-08-22 DIAGNOSIS — J3089 Other allergic rhinitis: Secondary | ICD-10-CM | POA: Diagnosis not present

## 2020-08-22 NOTE — Progress Notes (Signed)
VIALS EXP 08-22-21

## 2020-08-28 ENCOUNTER — Ambulatory Visit (INDEPENDENT_AMBULATORY_CARE_PROVIDER_SITE_OTHER): Payer: BC Managed Care – PPO

## 2020-08-28 DIAGNOSIS — J309 Allergic rhinitis, unspecified: Secondary | ICD-10-CM

## 2020-08-30 ENCOUNTER — Telehealth: Payer: Self-pay

## 2020-08-30 NOTE — Telephone Encounter (Signed)
Patient called with questions regarding breast discoloration. She states that the discoloration on her breast which was the result of the honeycomb dressing that was applied after br surgery on 02/01/20  has not improved. She would like to discuss ways to treat this and to see if Dr. Marla Roe has any suggestions, such as a particular cream or laser to improve the appearance.

## 2020-09-03 ENCOUNTER — Telehealth: Payer: Self-pay

## 2020-09-03 NOTE — Telephone Encounter (Signed)
Called and [facility] @ 9:45am asking the patient to give me a call back regarding the message below.//AB/CMA

## 2020-09-03 NOTE — Telephone Encounter (Signed)
Called the patient back and LMOM informing her that Dr. Marla Roe would like for her to set-up to have laser done at no charge.  Asked the patient to give the office a call back to schedule for a laser appointment.    Also informed the patient if she has any other questions to please give me a call back.  Informed the front desk that the patient will be calling back to schedule a Laser appointment.//AB/CMA

## 2020-09-03 NOTE — Telephone Encounter (Signed)
Spoke to pt. She wanted to know what foods she was allergic to. She was negative to all the foods we tested to last year. The blood work showed sensitivity to the alpha-gal she was to avoid all mammalian meats. Pt. Is also to avoid all dairy and gelatin. Pt. States she does eat cheese sticks. She only has broken out with hives here and there. Pt. Is on the fence about getting the covid injection.

## 2020-09-10 ENCOUNTER — Ambulatory Visit (INDEPENDENT_AMBULATORY_CARE_PROVIDER_SITE_OTHER): Payer: BC Managed Care – PPO

## 2020-09-10 DIAGNOSIS — J309 Allergic rhinitis, unspecified: Secondary | ICD-10-CM | POA: Diagnosis not present

## 2020-09-20 ENCOUNTER — Ambulatory Visit: Payer: Self-pay

## 2020-09-20 ENCOUNTER — Encounter: Payer: Self-pay | Admitting: Family Medicine

## 2020-09-20 ENCOUNTER — Ambulatory Visit: Payer: BC Managed Care – PPO | Admitting: Family Medicine

## 2020-09-20 ENCOUNTER — Other Ambulatory Visit: Payer: Self-pay

## 2020-09-20 VITALS — BP 122/84 | HR 84 | Temp 97.6°F | Resp 20 | Ht 68.0 in | Wt 230.0 lb

## 2020-09-20 DIAGNOSIS — J302 Other seasonal allergic rhinitis: Secondary | ICD-10-CM

## 2020-09-20 DIAGNOSIS — H1013 Acute atopic conjunctivitis, bilateral: Secondary | ICD-10-CM | POA: Diagnosis not present

## 2020-09-20 DIAGNOSIS — J3089 Other allergic rhinitis: Secondary | ICD-10-CM

## 2020-09-20 DIAGNOSIS — L500 Allergic urticaria: Secondary | ICD-10-CM

## 2020-09-20 DIAGNOSIS — J309 Allergic rhinitis, unspecified: Secondary | ICD-10-CM

## 2020-09-20 DIAGNOSIS — T7840XD Allergy, unspecified, subsequent encounter: Secondary | ICD-10-CM

## 2020-09-20 DIAGNOSIS — J4520 Mild intermittent asthma, uncomplicated: Secondary | ICD-10-CM | POA: Diagnosis not present

## 2020-09-20 MED ORDER — FLOVENT HFA 110 MCG/ACT IN AERO
INHALATION_SPRAY | RESPIRATORY_TRACT | 12 refills | Status: DC
Start: 2020-09-20 — End: 2022-04-09

## 2020-09-20 MED ORDER — ALBUTEROL SULFATE HFA 108 (90 BASE) MCG/ACT IN AERS: 1.0000 | INHALATION_SPRAY | RESPIRATORY_TRACT | 2 refills | Status: DC | PRN

## 2020-09-20 NOTE — Progress Notes (Signed)
[place] [id number] Dept: [phone]  FOLLOW UP NOTE  Patient ID: Amy Pope, female    DOB: 1976/01/26  Age: 44 y.o. MRN: [id number] Date of Office Visit: 09/20/2020  Assessment  Chief Complaint: Allergies (sneezing , itching and sob at times)  HPI Amy Pope is a 44 year old female who presents to the clinic for follow-up visit.  She was last seen in this clinic on 09/28/2019 by Dr. Verlin Fester for evaluation of asthma, allergic rhinitis, allergic conjunctivitis, alpha gal food allergy, and reaction with unknown trigger.  At today's visit, she reports her asthma has been moderately well controlled with occasional shortness of breath with vigorous activity.  She denies wheeze and cough.  She reports that she uses her albuterol about once every 1 or 2 months with relief of symptoms.  Allergic rhinitis is reported as moderately well controlled with frequent nasal congestion for which she continues nasal saline flushes as well as azelastine nasal spray.  She reports that recently she has experienced dried blood flakes while blowing her nose.  She reports these occur intermittently and completely disappear between episodes.  She continues allergen immunotherapy with no large local reactions.  She reports a significant decrease in her symptoms of allergic rhinitis while continuing on allergen immunotherapy.  Allergic conjunctivitis is reported as well controlled with no current medical intervention.  She does report that recently she has eaten a vegetarian burger with a piece of cheese and about 15 or 20 minutes after ingestion she began to experience hives and redness occurring on her face and neck.  She denies shortness of breath, throat closure, and gastrointestinal symptoms.  At that time she took an oral antihistamine and the redness and hives resolved within 30 to 45 minutes later.  She continues to avoid mammalian meat, however, occasionally eats other mammalian products  such as cheese.  Her current medications are listed in the chart.   Drug Allergies:  Allergies  Allergen Reactions  . Morphine Swelling  . Morphine And Related     Physical Exam: BP 122/84   Pulse 84   Temp 97.6 F (36.4 C) (Temporal)   Resp 20   Ht 5' 8"  (1.727 m)   Wt 230 lb (104.3 kg)   SpO2 99%   BMI 34.97 kg/m    Physical Exam Vitals reviewed.  Constitutional:      Appearance: Normal appearance.  HENT:     Head: Normocephalic and atraumatic.     Right Ear: Tympanic membrane normal.     Left Ear: Tympanic membrane normal.     Nose:     Comments: Bilateral nares slightly erythematous with no nasal drainage.  Pharynx normal.  Ears normal.  Eyes normal.    Mouth/Throat:     Pharynx: Oropharynx is clear.  Eyes:     Conjunctiva/sclera: Conjunctivae normal.  Cardiovascular:     Rate and Rhythm: Normal rate and regular rhythm.     Heart sounds: Normal heart sounds. No murmur heard.   Pulmonary:     Effort: Pulmonary effort is normal.     Breath sounds: Normal breath sounds.     Comments: Lungs clear to auscultation Musculoskeletal:        General: Normal range of motion.     Cervical back: Normal range of motion and neck supple.  Skin:    General: Skin is warm and dry.  Neurological:     Mental Status: She is alert and oriented to person, place, and time.  Psychiatric:        Mood and Affect: Mood normal.        Behavior: Behavior normal.        Thought Content: Thought content normal.        Judgment: Judgment normal.     Diagnostics: FVC 2.80, FEV1 2.19.  Predicted FVC 4.15, predicted FEV1 3.32.  Spirometry indicates mild restriction.  This is consistent with previous spirometry readings  Assessment and Plan: 1. Mild intermittent asthma, unspecified whether complicated   2. Seasonal and perennial allergic rhinitis   3. Allergic conjunctivitis of both eyes   4. Allergic reaction, subsequent encounter   5. Allergic urticaria     Meds ordered this  encounter  Medications  . fluticasone (FLOVENT HFA) 110 MCG/ACT inhaler    Sig: For asthma flare, begin Flovent 110-2 puffs twice a day with a spacer for two weeks or until cough and wheeze free    Dispense:  1 each    Refill:  12    Please hold. Patient will call when needed  . albuterol (VENTOLIN HFA) 108 (90 Base) MCG/ACT inhaler    Sig: Inhale 1-2 puffs into the lungs as needed for wheezing or shortness of breath (every 4-6 hours).    Dispense:  18 g    Refill:  2    Patient Instructions  Asthma Continue albuterol 2 puffs once every 4 hours as needed for cough or wheeze You may use albuterol 2 puffs 5 to 15 minutes before activity to decrease cough or wheeze For asthma flare, begin Flovent 110-2 puffs twice a day with a spacer for two weeks or until cough and wheeze free. This medication will be on hold at your pharmacy. You will need to call them if you need to fill this prescription  Allergic rhinitis Continue allergen avoidance measures directed toward mold, cockroach, cat, dog, dust mite, and weed pollen Continue your over the counter antihistamine once a day as needed for runny nose. Remember to rotate to a different antihistamine about every 3 months. Some examples of over the counter antihistamines include Zyrtec (cetirizine), Xyzal (levocetirizine), Allegra (fexofenadine), and Claritin (loratidine).  Continue azelastine 2 sprays in each nostril twice a day as needed for runny nose Consider saline nasal rinses as needed for nasal symptoms. Use this before any medicated nasal sprays for best result Continue allergen immunotherapy and have success to an epinephrine autoinjector set  Allergic conjunctivitis Continue Pataday 1 drop in each eye once a day as needed for red itchy eyes  Alpha gal allergy Continue to avoid mammalian meat, dairy, and products containing gelatin.  In case of an allergic reaction, take Benadryl 50 mg every 4 hours, and if life-threatening symptoms  occur, inject with AuviQ 0.3 mg.  Call the clinic if this treatment plan is not working well for you  Follow up in 6 months or sooner if needed.   Return in about 6 months (around 03/21/2021), or if symptoms worsen or fail to improve.    Thank you for the opportunity to care for this patient.  Please do not hesitate to contact me with questions.  Gareth Morgan, FNP [facility]

## 2020-09-20 NOTE — Patient Instructions (Signed)
Asthma Continue albuterol 2 puffs once every 4 hours as needed for cough or wheeze You may use albuterol 2 puffs 5 to 15 minutes before activity to decrease cough or wheeze For asthma flare, begin Flovent 110-2 puffs twice a day with a spacer for two weeks or until cough and wheeze free. This medication will be on hold at your pharmacy. You will need to call them if you need to fill this prescription  Allergic rhinitis Continue allergen avoidance measures directed toward mold, cockroach, cat, dog, dust mite, and weed pollen Continue your over the counter antihistamine once a day as needed for runny nose. Remember to rotate to a different antihistamine about every 3 months. Some examples of over the counter antihistamines include Zyrtec (cetirizine), Xyzal (levocetirizine), Allegra (fexofenadine), and Claritin (loratidine).  Continue azelastine 2 sprays in each nostril twice a day as needed for runny nose Consider saline nasal rinses as needed for nasal symptoms. Use this before any medicated nasal sprays for best result Continue allergen immunotherapy and have success to an epinephrine autoinjector set  Allergic conjunctivitis Continue Pataday 1 drop in each eye once a day as needed for red itchy eyes  Alpha gal allergy Continue to avoid mammalian meat, dairy, and products containing gelatin.  In case of an allergic reaction, take Benadryl 50 mg every 4 hours, and if life-threatening symptoms occur, inject with AuviQ 0.3 mg.  Call the clinic if this treatment plan is not working well for you  Follow up in 6 months or sooner if needed.

## 2020-10-11 ENCOUNTER — Ambulatory Visit (INDEPENDENT_AMBULATORY_CARE_PROVIDER_SITE_OTHER): Payer: BC Managed Care – PPO | Admitting: *Deleted

## 2020-10-11 ENCOUNTER — Ambulatory Visit: Payer: BC Managed Care – PPO

## 2020-10-11 DIAGNOSIS — J309 Allergic rhinitis, unspecified: Secondary | ICD-10-CM | POA: Diagnosis not present

## 2020-10-14 ENCOUNTER — Other Ambulatory Visit: Payer: Self-pay

## 2020-10-14 MED ORDER — AZELASTINE HCL 0.1 % NA SOLN: 2.0000 | Freq: Two times a day (BID) | NASAL | 4 refills | Status: DC

## 2020-10-15 ENCOUNTER — Other Ambulatory Visit: Payer: Self-pay

## 2020-10-15 MED ORDER — AZELASTINE HCL 0.1 % NA SOLN: 2.0000 | Freq: Two times a day (BID) | NASAL | 1 refills | Status: DC

## 2020-10-18 ENCOUNTER — Ambulatory Visit (INDEPENDENT_AMBULATORY_CARE_PROVIDER_SITE_OTHER): Payer: BC Managed Care – PPO | Admitting: *Deleted

## 2020-10-18 ENCOUNTER — Other Ambulatory Visit: Payer: Self-pay

## 2020-10-18 DIAGNOSIS — J309 Allergic rhinitis, unspecified: Secondary | ICD-10-CM | POA: Diagnosis not present

## 2020-10-18 MED ORDER — AZELASTINE HCL 0.15 % NA SOLN: NASAL | 1 refills | Status: DC

## 2020-10-22 ENCOUNTER — Ambulatory Visit (INDEPENDENT_AMBULATORY_CARE_PROVIDER_SITE_OTHER): Payer: BC Managed Care – PPO

## 2020-10-22 DIAGNOSIS — J309 Allergic rhinitis, unspecified: Secondary | ICD-10-CM

## 2020-11-01 ENCOUNTER — Ambulatory Visit (INDEPENDENT_AMBULATORY_CARE_PROVIDER_SITE_OTHER): Payer: BC Managed Care – PPO

## 2020-11-01 DIAGNOSIS — J309 Allergic rhinitis, unspecified: Secondary | ICD-10-CM

## 2020-11-05 ENCOUNTER — Ambulatory Visit (INDEPENDENT_AMBULATORY_CARE_PROVIDER_SITE_OTHER): Payer: BC Managed Care – PPO

## 2020-11-05 DIAGNOSIS — J309 Allergic rhinitis, unspecified: Secondary | ICD-10-CM | POA: Diagnosis not present

## 2020-11-08 ENCOUNTER — Other Ambulatory Visit: Payer: Self-pay

## 2020-11-08 ENCOUNTER — Ambulatory Visit (INDEPENDENT_AMBULATORY_CARE_PROVIDER_SITE_OTHER): Payer: Self-pay | Admitting: Plastic Surgery

## 2020-11-08 ENCOUNTER — Encounter: Payer: Self-pay | Admitting: Plastic Surgery

## 2020-11-08 VITALS — BP 116/64 | HR 69 | Temp 98.2°F

## 2020-11-08 DIAGNOSIS — Z9889 Other specified postprocedural states: Secondary | ICD-10-CM

## 2020-11-08 NOTE — Progress Notes (Signed)
Preoperative Dx: Discoloration after a honeycomb dressing placed at the time of her breast reduction  Postoperative Dx:  same  Procedure: laser to right lateral breast discoloration from honeycomb dressing  Anesthesia: none  Description of Procedure:  Risks and complications were explained to the patient. Consent was confirmed and signed. Time out was called and all information was confirmed to be correct. The area  area was prepped with alcohol and wiped dry. The IPL laser was set at 7.0 J/cm2. The right lateral breast was lasered. The patient tolerated the procedure well and there were no complications. The patient is to follow up in 4 weeks.

## 2020-11-12 ENCOUNTER — Ambulatory Visit (INDEPENDENT_AMBULATORY_CARE_PROVIDER_SITE_OTHER): Payer: BC Managed Care – PPO

## 2020-11-12 DIAGNOSIS — J309 Allergic rhinitis, unspecified: Secondary | ICD-10-CM | POA: Diagnosis not present

## 2020-11-18 ENCOUNTER — Ambulatory Visit (INDEPENDENT_AMBULATORY_CARE_PROVIDER_SITE_OTHER): Payer: BC Managed Care – PPO

## 2020-11-18 DIAGNOSIS — J309 Allergic rhinitis, unspecified: Secondary | ICD-10-CM | POA: Diagnosis not present

## 2020-12-04 NOTE — Progress Notes (Signed)
VIALS EXP 12-05-21

## 2020-12-05 DIAGNOSIS — J3089 Other allergic rhinitis: Secondary | ICD-10-CM | POA: Diagnosis not present

## 2020-12-06 ENCOUNTER — Ambulatory Visit (INDEPENDENT_AMBULATORY_CARE_PROVIDER_SITE_OTHER): Payer: BC Managed Care – PPO

## 2020-12-06 DIAGNOSIS — J309 Allergic rhinitis, unspecified: Secondary | ICD-10-CM

## 2020-12-13 ENCOUNTER — Ambulatory Visit (INDEPENDENT_AMBULATORY_CARE_PROVIDER_SITE_OTHER): Payer: BC Managed Care – PPO

## 2020-12-13 DIAGNOSIS — J309 Allergic rhinitis, unspecified: Secondary | ICD-10-CM

## 2020-12-20 ENCOUNTER — Ambulatory Visit (INDEPENDENT_AMBULATORY_CARE_PROVIDER_SITE_OTHER): Payer: BC Managed Care – PPO

## 2020-12-20 DIAGNOSIS — J309 Allergic rhinitis, unspecified: Secondary | ICD-10-CM

## 2021-01-03 ENCOUNTER — Ambulatory Visit (INDEPENDENT_AMBULATORY_CARE_PROVIDER_SITE_OTHER): Payer: BC Managed Care – PPO

## 2021-01-03 ENCOUNTER — Other Ambulatory Visit: Payer: BC Managed Care – PPO | Admitting: Plastic Surgery

## 2021-01-03 ENCOUNTER — Other Ambulatory Visit: Payer: Self-pay

## 2021-01-03 ENCOUNTER — Ambulatory Visit
Admission: RE | Admit: 2021-01-03 | Discharge: 2021-01-03 | Disposition: A | Discharge status: Home / Self Care | Payer: BC Managed Care – PPO | Source: Ambulatory Visit | Attending: Plastic Surgery | Admitting: Plastic Surgery

## 2021-01-03 DIAGNOSIS — M542 Cervicalgia: Secondary | ICD-10-CM

## 2021-01-03 DIAGNOSIS — M546 Pain in thoracic spine: Secondary | ICD-10-CM

## 2021-01-03 DIAGNOSIS — G8929 Other chronic pain: Secondary | ICD-10-CM

## 2021-01-03 DIAGNOSIS — N62 Hypertrophy of breast: Secondary | ICD-10-CM

## 2021-01-03 DIAGNOSIS — J309 Allergic rhinitis, unspecified: Secondary | ICD-10-CM

## 2021-01-07 ENCOUNTER — Ambulatory Visit (INDEPENDENT_AMBULATORY_CARE_PROVIDER_SITE_OTHER): Payer: BC Managed Care – PPO

## 2021-01-07 DIAGNOSIS — J309 Allergic rhinitis, unspecified: Secondary | ICD-10-CM | POA: Diagnosis not present

## 2021-01-10 ENCOUNTER — Ambulatory Visit: Payer: BC Managed Care – PPO | Admitting: Podiatry

## 2021-01-10 ENCOUNTER — Other Ambulatory Visit: Payer: Self-pay

## 2021-01-10 DIAGNOSIS — B351 Tinea unguium: Secondary | ICD-10-CM

## 2021-01-10 DIAGNOSIS — Z79899 Other long term (current) drug therapy: Secondary | ICD-10-CM | POA: Diagnosis not present

## 2021-01-10 LAB — CBC WITH DIFFERENTIAL/PLATELET
Absolute Monocytes: 509 cells/uL (ref 200–950)
Basophils Absolute: 60 cells/uL (ref 0–200)
Basophils Relative: 0.9 %
Eosinophils Absolute: 147 cells/uL (ref 15–500)
Eosinophils Relative: 2.2 %
HCT: 36.1 % (ref 35.0–45.0)
Hemoglobin: 12.3 g/dL (ref 11.7–15.5)
Lymphs Abs: 1675 cells/uL (ref 850–3900)
MCH: 30.9 pg (ref 27.0–33.0)
MCHC: 34.1 g/dL (ref 32.0–36.0)
MCV: 90.7 fL (ref 80.0–100.0)
MPV: 10 fL (ref 7.5–12.5)
Monocytes Relative: 7.6 %
Neutro Abs: 4308 cells/uL (ref 1500–7800)
Neutrophils Relative %: 64.3 %
Platelets: 378 10*3/uL (ref 140–400)
RBC: 3.98 10*6/uL (ref 3.80–5.10)
RDW: 12.3 % (ref 11.0–15.0)
Total Lymphocyte: 25 %
WBC: 6.7 10*3/uL (ref 3.8–10.8)

## 2021-01-10 LAB — HEPATIC FUNCTION PANEL
AG Ratio: 1.7 (calc) (ref 1.0–2.5)
ALT: 13 U/L (ref 6–29)
AST: 13 U/L (ref 10–35)
Albumin: 4.1 g/dL (ref 3.6–5.1)
Alkaline phosphatase (APISO): 58 U/L (ref 31–125)
Bilirubin, Direct: 0.1 mg/dL (ref 0.0–0.2)
Globulin: 2.4 g/dL (calc) (ref 1.9–3.7)
Indirect Bilirubin: 0.4 mg/dL (calc) (ref 0.2–1.2)
Total Bilirubin: 0.5 mg/dL (ref 0.2–1.2)
Total Protein: 6.5 g/dL (ref 6.1–8.1)

## 2021-01-10 NOTE — Patient Instructions (Addendum)
Terbinafine oral granules What is this medicine? TERBINAFINE (TER bin a feen) is an antifungal medicine. It is used to treat certain kinds of fungal or yeast infections. This medicine may be used for other purposes; ask your health care provider or pharmacist if you have questions. COMMON BRAND NAME(S): Lamisil What should I tell my health care provider before I take this medicine? They need to know if you have any of these conditions:  drink alcoholic beverages  kidney disease  liver disease  an unusual or allergic reaction to Terbinafine, other medicines, foods, dyes, or preservatives  pregnant or trying to get pregnant  breast-feeding How should I use this medicine? Take this medicine by mouth. Follow the directions on the prescription label. Hold packet with cut line on top. Shake packet gently to settle contents. Tear packet open along cut line, or use scissors to cut across line. Carefully pour the entire contents of packet onto a spoonful of a soft food, such as pudding or other soft, non-acidic food such as mashed potatoes (do NOT use applesauce or a fruit-based food). If two packets are required for each dose, you may either sprinkle the content of both packets on one spoonful of non-acidic food, or sprinkle the contents of both packets on two spoonfuls of non-acidic food. Make sure that no granules remain in the packet. Swallow the mxiture of the food and granules without chewing. Take your medicine at regular intervals. Do not take it more often than directed. Take all of your medicine as directed even if you think you are better. Do not skip doses or stop your medicine early. Contact your pediatrician or health care professional regarding the use of this medicine in children. While this medicine may be prescribed for children as young as 4 years for selected conditions, precautions do apply. Overdosage: If you think you have taken too much of this medicine contact a poison control  center or emergency room at once. NOTE: This medicine is only for you. Do not share this medicine with others. What if I miss a dose? If you miss a dose, take it as soon as you can. If it is almost time for your next dose, take only that dose. Do not take double or extra doses. What may interact with this medicine? Do not take this medicine with any of the following medications:  thioridazine This medicine may also interact with the following medications:  beta-blockers  caffeine  cimetidine  cyclosporine  MAOIs like Carbex, Eldepryl, Marplan, Nardil, and Parnate  medicines for fungal infections like fluconazole and ketoconazole  medicines for irregular heartbeat like amiodarone, flecainide and propafenone  rifampin  SSRIs like citalopram, escitalopram, fluoxetine, fluvoxamine, paroxetine and sertraline  tricyclic antidepressants like amitriptyline, clomipramine, desipramine, imipramine, nortriptyline, and others  warfarin This list may not describe all possible interactions. Give your health care provider a list of all the medicines, herbs, non-prescription drugs, or dietary supplements you use. Also tell them if you smoke, drink alcohol, or use illegal drugs. Some items may interact with your medicine. What should I watch for while using this medicine? Your doctor may monitor your liver function. Tell your doctor right away if you have nausea or vomiting, loss of appetite, stomach pain on your right upper side, yellow skin, dark urine, light stools, or are over tired. This medicine may cause serious skin reactions. They can happen weeks to months after starting the medicine. Contact your health care provider right away if you notice fevers or flu-like symptoms  with a rash. The rash may be red or purple and then turn into blisters or peeling of the skin. Or, you might notice a red rash with swelling of the face, lips or lymph nodes in your neck or under your arms. You need to take  this medicine for 6 weeks or longer to cure the fungal infection. Take your medicine regularly for as long as your doctor or health care provider tells you to. What side effects may I notice from receiving this medicine? Side effects that you should report to your doctor or health care professional as soon as possible:  allergic reactions like skin rash or hives, swelling of the face, lips, or tongue  change in vision  dark urine  fever or infection  general ill feeling or flu-like symptoms  light-colored stools  loss of appetite, nausea  rash, fever, and swollen lymph nodes  redness, blistering, peeling or loosening of the skin, including inside the mouth  right upper belly pain  unusually weak or tired  yellowing of the eyes or skin Side effects that usually do not require medical attention (report to your doctor or health care professional if they continue or are bothersome):  changes in taste  diarrhea  hair loss  muscle or joint pain  stomach upset This list may not describe all possible side effects. Call your doctor for medical advice about side effects. You may report side effects to FDA at 1-800-FDA-1088. Where should I keep my medicine? Keep out of the reach of children. Store at room temperature between 15 and 30 degrees C (59 and 86 degrees F). Throw away any unused medicine after the expiration date. NOTE: This sheet is a summary. It may not cover all possible information. If you have questions about this medicine, talk to your doctor, pharmacist, or health care provider.  2021 Elsevier/Gold Standard (2019-02-24 15:35:11)

## 2021-01-14 NOTE — Progress Notes (Signed)
Subjective:   Patient ID: Amy Pope, female   DOB: 45 y.o.   MRN: [id number]   HPI 45 year old female presents the office today for concerns of toenail fungus.  She said that she had a pedicure about a year ago and since then she noticed her nails becoming thickened discolored particular her right big toenail.  She saw her primary care physician about a month ago.  She has been asked for this area..  She is also given an "antibiotic that started with a T".  She only took about a week and half of this before stopping it as she got sick and did not want to lower her immune system any further.  Denies any drainage or pus or any pain to the toenail sites this time.  She has no other concerns today.   Review of Systems  All other systems reviewed and are negative.  Past Medical History:  Diagnosis Date  . Asthma due to seasonal allergies   . Chronic back pain   . Mild intermittent asthma 09/28/2019  . Urticaria     Past Surgical History:  Procedure Laterality Date  . BACK SURGERY    . BREAST REDUCTION SURGERY Bilateral 02/01/2020   Procedure: BILATERAL MAMMARY REDUCTION  (BREAST) WITH LIPOSUCTION;  Surgeon: Wallace Going, DO;  Location: [place] [facility];  Service: Plastics;  Laterality: Bilateral;  3.5 hours, please  . CESAREAN SECTION    . ENDOMETRIAL ABLATION    . TUBAL LIGATION       Current Outpatient Medications:  .  albuterol (VENTOLIN HFA) 108 (90 Base) MCG/ACT inhaler, Inhale 1-2 puffs into the lungs as needed for wheezing or shortness of breath (every 4-6 hours)., Disp: 18 g, Rfl: 2 .  ALPRAZolam (XANAX) 1 MG tablet, Take 1 mg by mouth at bedtime as needed for anxiety., Disp: , Rfl:  .  Azelastine HCl 0.15 % SOLN, Can use two sprays in each nostril twice daily if needed., Disp: 90 mL, Rfl: 1 .  fluticasone (FLOVENT HFA) 110 MCG/ACT inhaler, For asthma flare, begin Flovent 110-2 puffs twice a day with a spacer for two weeks or until cough and wheeze free, Disp:  1 each, Rfl: 12 .  ibuprofen (ADVIL,MOTRIN) 800 MG tablet, Take 800 mg by mouth every 8 (eight) hours as needed (duexis)., Disp: , Rfl:  .  Ibuprofen-Famotidine (DUEXIS) 800-26.6 MG TABS, Take by mouth. (Patient not taking: Reported on 09/20/2020), Disp: , Rfl:  .  levocetirizine (XYZAL) 5 MG tablet, Take 1 tablet (5 mg total) by mouth daily., Disp: 30 tablet, Rfl: 5 .  meloxicam (MOBIC) 15 MG tablet, Take 15 mg by mouth daily., Disp: , Rfl:  .  Misc Natural Products (ESTROVEN ENERGY PO), Take by mouth. (Patient not taking: Reported on 09/20/2020), Disp: , Rfl:  .  Misc Natural Products (PRO HERBS IMMUNE DEFENSE PO), Take by mouth., Disp: , Rfl:  .  Multiple Vitamin (MULTIVITAMIN) tablet, Take 1 tablet by mouth daily., Disp: , Rfl:  .  Olopatadine HCl (PAZEO) 0.7 % SOLN, Place 1 drop into both eyes 1 day or 1 dose., Disp: 2.5 mL, Rfl: 5  Allergies  Allergen Reactions  . Morphine Swelling  . Morphine And Related   . Morphine Sulfate Other (See Comments)         Objective:  Physical Exam  General: AAO x3, NAD  Dermatological: Nails appear be hypertrophic, dystrophic with yellow discoloration.  There is no edema, erythema, drainage or pus or any signs of  infection.  Vascular: Dorsalis Pedis artery and Posterior Tibial artery pedal pulses are 2/4 bilateral with immedate capillary fill time. There is no pain with calf compression, swelling, warmth, erythema.   Neruologic: Grossly intact via light touch bilateral.   Musculoskeletal: No gross boney pedal deformities bilateral. No pain, crepitus, or limitation noted with foot and ankle range of motion bilateral. Muscular strength 5/5 in all groups tested bilateral.  Gait: Unassisted, Nonantalgic.       Assessment:   Onychomycosis     Plan:  -Treatment options discussed including all alternatives, risks, and complications -Etiology of symptoms were discussed -Discussed further treatment options.  After discussion of options to  continue Lamisil.  We will check a CBC and LFT prior to starting medication we will do 30 days medication to start. Discussed side effects of medications as well as success rates.  We also discussed laser.  Trula Slade DPM

## 2021-01-15 ENCOUNTER — Other Ambulatory Visit: Payer: Self-pay | Admitting: Podiatry

## 2021-01-15 DIAGNOSIS — Z79899 Other long term (current) drug therapy: Secondary | ICD-10-CM

## 2021-01-15 MED ORDER — TERBINAFINE HCL 250 MG PO TABS: 250.0000 mg | ORAL_TABLET | Freq: Every day | ORAL | 0 refills | Status: DC

## 2021-01-17 ENCOUNTER — Ambulatory Visit (INDEPENDENT_AMBULATORY_CARE_PROVIDER_SITE_OTHER): Payer: BC Managed Care – PPO

## 2021-01-17 DIAGNOSIS — J309 Allergic rhinitis, unspecified: Secondary | ICD-10-CM

## 2021-01-23 ENCOUNTER — Ambulatory Visit (INDEPENDENT_AMBULATORY_CARE_PROVIDER_SITE_OTHER): Payer: BC Managed Care – PPO

## 2021-01-23 DIAGNOSIS — J309 Allergic rhinitis, unspecified: Secondary | ICD-10-CM | POA: Diagnosis not present

## 2021-01-28 ENCOUNTER — Ambulatory Visit (INDEPENDENT_AMBULATORY_CARE_PROVIDER_SITE_OTHER): Payer: BC Managed Care – PPO

## 2021-01-28 DIAGNOSIS — J309 Allergic rhinitis, unspecified: Secondary | ICD-10-CM | POA: Diagnosis not present

## 2021-02-04 ENCOUNTER — Ambulatory Visit (INDEPENDENT_AMBULATORY_CARE_PROVIDER_SITE_OTHER): Payer: BC Managed Care – PPO

## 2021-02-04 DIAGNOSIS — J309 Allergic rhinitis, unspecified: Secondary | ICD-10-CM

## 2021-02-10 ENCOUNTER — Telehealth: Payer: Self-pay

## 2021-02-10 MED ORDER — LEVOCETIRIZINE DIHYDROCHLORIDE 5 MG PO TABS: 5.0000 mg | ORAL_TABLET | Freq: Every day | ORAL | 0 refills | Status: DC

## 2021-02-10 NOTE — Telephone Encounter (Signed)
Refill request for levocetirizine 36m  Patients last appointment was 09/20/20 and needed a 6 month follow up visit per the doctor. I can give a courtesy refill and let the patient know she needs to schedule an appointment to receive any other refills.   ASeth Bake [phone]

## 2021-02-11 ENCOUNTER — Ambulatory Visit (INDEPENDENT_AMBULATORY_CARE_PROVIDER_SITE_OTHER): Payer: BC Managed Care – PPO

## 2021-02-11 DIAGNOSIS — J309 Allergic rhinitis, unspecified: Secondary | ICD-10-CM | POA: Diagnosis not present

## 2021-02-17 ENCOUNTER — Ambulatory Visit (INDEPENDENT_AMBULATORY_CARE_PROVIDER_SITE_OTHER): Payer: BC Managed Care – PPO

## 2021-02-17 DIAGNOSIS — J309 Allergic rhinitis, unspecified: Secondary | ICD-10-CM

## 2021-02-25 ENCOUNTER — Ambulatory Visit (INDEPENDENT_AMBULATORY_CARE_PROVIDER_SITE_OTHER): Payer: BC Managed Care – PPO

## 2021-02-25 DIAGNOSIS — J309 Allergic rhinitis, unspecified: Secondary | ICD-10-CM

## 2021-02-25 NOTE — Progress Notes (Signed)
Vials exp 02-25-22

## 2021-02-26 DIAGNOSIS — J3089 Other allergic rhinitis: Secondary | ICD-10-CM

## 2021-02-28 ENCOUNTER — Other Ambulatory Visit: Payer: Self-pay

## 2021-02-28 ENCOUNTER — Encounter: Payer: Self-pay | Admitting: Plastic Surgery

## 2021-02-28 ENCOUNTER — Ambulatory Visit (INDEPENDENT_AMBULATORY_CARE_PROVIDER_SITE_OTHER): Payer: Self-pay | Admitting: Plastic Surgery

## 2021-02-28 VITALS — BP 124/82 | HR 70

## 2021-02-28 DIAGNOSIS — N62 Hypertrophy of breast: Secondary | ICD-10-CM

## 2021-02-28 DIAGNOSIS — Z719 Counseling, unspecified: Secondary | ICD-10-CM | POA: Insufficient documentation

## 2021-02-28 NOTE — Progress Notes (Signed)
Preoperative Dx: hyperpigmentation of right breast  Postoperative Dx:  same  Procedure: laser to right breast   Anesthesia: none  Description of Procedure:  Risks and complications were explained to the patient. Consent was confirmed and signed. Time out was called and all information was confirmed to be correct. The area  area was prepped with alcohol and wiped dry. The IPL laser was set at 8 J/cm2. The right lateral breast was lasered. The patient tolerated the procedure well and there were no complications. The patient is to follow up in 4 weeks.

## 2021-03-04 ENCOUNTER — Ambulatory Visit (INDEPENDENT_AMBULATORY_CARE_PROVIDER_SITE_OTHER): Payer: BC Managed Care – PPO

## 2021-03-04 DIAGNOSIS — J309 Allergic rhinitis, unspecified: Secondary | ICD-10-CM | POA: Diagnosis not present

## 2021-03-12 ENCOUNTER — Ambulatory Visit (INDEPENDENT_AMBULATORY_CARE_PROVIDER_SITE_OTHER): Payer: BC Managed Care – PPO | Admitting: *Deleted

## 2021-03-12 DIAGNOSIS — J309 Allergic rhinitis, unspecified: Secondary | ICD-10-CM

## 2021-03-18 ENCOUNTER — Ambulatory Visit (INDEPENDENT_AMBULATORY_CARE_PROVIDER_SITE_OTHER): Payer: BC Managed Care – PPO

## 2021-03-18 DIAGNOSIS — J309 Allergic rhinitis, unspecified: Secondary | ICD-10-CM | POA: Diagnosis not present

## 2021-03-21 ENCOUNTER — Other Ambulatory Visit (HOSPITAL_BASED_OUTPATIENT_CLINIC_OR_DEPARTMENT_OTHER): Payer: Self-pay

## 2021-03-28 ENCOUNTER — Ambulatory Visit (INDEPENDENT_AMBULATORY_CARE_PROVIDER_SITE_OTHER): Payer: BC Managed Care – PPO

## 2021-03-28 DIAGNOSIS — J309 Allergic rhinitis, unspecified: Secondary | ICD-10-CM

## 2021-04-09 ENCOUNTER — Ambulatory Visit (INDEPENDENT_AMBULATORY_CARE_PROVIDER_SITE_OTHER): Payer: BC Managed Care – PPO

## 2021-04-09 DIAGNOSIS — J309 Allergic rhinitis, unspecified: Secondary | ICD-10-CM

## 2021-04-21 ENCOUNTER — Ambulatory Visit (INDEPENDENT_AMBULATORY_CARE_PROVIDER_SITE_OTHER): Payer: BC Managed Care – PPO

## 2021-04-21 DIAGNOSIS — J309 Allergic rhinitis, unspecified: Secondary | ICD-10-CM

## 2021-05-01 ENCOUNTER — Ambulatory Visit (INDEPENDENT_AMBULATORY_CARE_PROVIDER_SITE_OTHER): Payer: BC Managed Care – PPO

## 2021-05-01 DIAGNOSIS — J309 Allergic rhinitis, unspecified: Secondary | ICD-10-CM | POA: Diagnosis not present

## 2021-05-09 ENCOUNTER — Ambulatory Visit: Payer: BC Managed Care – PPO | Admitting: Podiatry

## 2021-05-09 ENCOUNTER — Other Ambulatory Visit: Payer: Self-pay

## 2021-05-09 ENCOUNTER — Encounter: Payer: Self-pay | Admitting: Podiatry

## 2021-05-09 ENCOUNTER — Ambulatory Visit (INDEPENDENT_AMBULATORY_CARE_PROVIDER_SITE_OTHER): Payer: BC Managed Care – PPO

## 2021-05-09 DIAGNOSIS — B351 Tinea unguium: Secondary | ICD-10-CM

## 2021-05-09 DIAGNOSIS — L0390 Cellulitis, unspecified: Secondary | ICD-10-CM | POA: Insufficient documentation

## 2021-05-09 DIAGNOSIS — Z780 Asymptomatic menopausal state: Secondary | ICD-10-CM | POA: Insufficient documentation

## 2021-05-09 DIAGNOSIS — D518 Other vitamin B12 deficiency anemias: Secondary | ICD-10-CM | POA: Insufficient documentation

## 2021-05-09 DIAGNOSIS — M5432 Sciatica, left side: Secondary | ICD-10-CM | POA: Insufficient documentation

## 2021-05-09 DIAGNOSIS — M150 Primary generalized (osteo)arthritis: Secondary | ICD-10-CM | POA: Insufficient documentation

## 2021-05-09 DIAGNOSIS — J302 Other seasonal allergic rhinitis: Secondary | ICD-10-CM | POA: Insufficient documentation

## 2021-05-09 DIAGNOSIS — J3089 Other allergic rhinitis: Secondary | ICD-10-CM | POA: Insufficient documentation

## 2021-05-09 DIAGNOSIS — J309 Allergic rhinitis, unspecified: Secondary | ICD-10-CM | POA: Diagnosis not present

## 2021-05-09 DIAGNOSIS — K581 Irritable bowel syndrome with constipation: Secondary | ICD-10-CM | POA: Insufficient documentation

## 2021-05-09 DIAGNOSIS — N926 Irregular menstruation, unspecified: Secondary | ICD-10-CM | POA: Insufficient documentation

## 2021-05-09 DIAGNOSIS — M5137 Other intervertebral disc degeneration, lumbosacral region: Secondary | ICD-10-CM | POA: Insufficient documentation

## 2021-05-09 DIAGNOSIS — E782 Mixed hyperlipidemia: Secondary | ICD-10-CM | POA: Insufficient documentation

## 2021-05-09 DIAGNOSIS — E669 Obesity, unspecified: Secondary | ICD-10-CM | POA: Insufficient documentation

## 2021-05-09 DIAGNOSIS — N959 Unspecified menopausal and perimenopausal disorder: Secondary | ICD-10-CM | POA: Insufficient documentation

## 2021-05-09 DIAGNOSIS — K219 Gastro-esophageal reflux disease without esophagitis: Secondary | ICD-10-CM | POA: Insufficient documentation

## 2021-05-09 DIAGNOSIS — E559 Vitamin D deficiency, unspecified: Secondary | ICD-10-CM | POA: Insufficient documentation

## 2021-05-09 DIAGNOSIS — I10 Essential (primary) hypertension: Secondary | ICD-10-CM | POA: Insufficient documentation

## 2021-05-09 NOTE — Progress Notes (Signed)
Subjective: 45 year old female presents the office today for follow-up evaluation of nail fungus.  She states that she has completed the course of Lamisil.  She had to miss a couple weeks at a time due to other infections and antibiotics but she did complete 90 days of a total.  She has seen some improvement the nails.  Denies any redness or drainage or any swelling.Denies any systemic complaints such as fevers, chills, nausea, vomiting. No acute changes since last appointment, and no other complaints at this time.   Objective: AAO x3, NAD DP/PT pulses palpable bilaterally, CRT less than 3 seconds Overall there is still discoloration of the nails with yellow discoloration.  Hallux nails otherwise affected and they are still hypertrophic and dystrophic as well.  There is no pain in the nails there is no emergency drainage or signs of infection.  No pain with calf compression, swelling, warmth, erythema  Assessment: Onychomycosis  Plan: -All treatment options discussed with the patient including all alternatives, risks, complications.  -She is completing 90 days of Lamisil.  She does come off the medication.  She has a topical antifungal and she will continue with this for now.  If no significant provement couple months we will likely restart 97-monthcourse of Lamisil. -Patient encouraged to call the office with any questions, concerns, change in symptoms.   MTrula SladeDPM

## 2021-05-13 ENCOUNTER — Ambulatory Visit (INDEPENDENT_AMBULATORY_CARE_PROVIDER_SITE_OTHER): Payer: BC Managed Care – PPO

## 2021-05-13 DIAGNOSIS — J309 Allergic rhinitis, unspecified: Secondary | ICD-10-CM | POA: Diagnosis not present

## 2021-05-26 ENCOUNTER — Ambulatory Visit (INDEPENDENT_AMBULATORY_CARE_PROVIDER_SITE_OTHER): Payer: BC Managed Care – PPO

## 2021-05-26 DIAGNOSIS — J309 Allergic rhinitis, unspecified: Secondary | ICD-10-CM | POA: Diagnosis not present

## 2021-06-03 DIAGNOSIS — J3089 Other allergic rhinitis: Secondary | ICD-10-CM

## 2021-06-03 NOTE — Progress Notes (Signed)
VIALS MADE. EXP 06-03-22

## 2021-06-10 ENCOUNTER — Ambulatory Visit (INDEPENDENT_AMBULATORY_CARE_PROVIDER_SITE_OTHER): Payer: BC Managed Care – PPO

## 2021-06-10 DIAGNOSIS — J309 Allergic rhinitis, unspecified: Secondary | ICD-10-CM | POA: Diagnosis not present

## 2021-06-26 ENCOUNTER — Ambulatory Visit (INDEPENDENT_AMBULATORY_CARE_PROVIDER_SITE_OTHER): Payer: BC Managed Care – PPO

## 2021-06-26 DIAGNOSIS — J309 Allergic rhinitis, unspecified: Secondary | ICD-10-CM | POA: Diagnosis not present

## 2021-07-10 ENCOUNTER — Ambulatory Visit (INDEPENDENT_AMBULATORY_CARE_PROVIDER_SITE_OTHER): Payer: BC Managed Care – PPO

## 2021-07-10 DIAGNOSIS — J309 Allergic rhinitis, unspecified: Secondary | ICD-10-CM

## 2021-07-18 ENCOUNTER — Ambulatory Visit (INDEPENDENT_AMBULATORY_CARE_PROVIDER_SITE_OTHER): Payer: BC Managed Care – PPO | Admitting: *Deleted

## 2021-07-18 DIAGNOSIS — J309 Allergic rhinitis, unspecified: Secondary | ICD-10-CM | POA: Diagnosis not present

## 2021-07-24 ENCOUNTER — Ambulatory Visit (INDEPENDENT_AMBULATORY_CARE_PROVIDER_SITE_OTHER): Payer: BC Managed Care – PPO

## 2021-07-24 DIAGNOSIS — J309 Allergic rhinitis, unspecified: Secondary | ICD-10-CM | POA: Diagnosis not present

## 2021-07-28 ENCOUNTER — Other Ambulatory Visit (HOSPITAL_COMMUNITY): Payer: Self-pay

## 2021-07-31 ENCOUNTER — Ambulatory Visit (INDEPENDENT_AMBULATORY_CARE_PROVIDER_SITE_OTHER): Payer: BC Managed Care – PPO

## 2021-07-31 DIAGNOSIS — J309 Allergic rhinitis, unspecified: Secondary | ICD-10-CM | POA: Diagnosis not present

## 2021-08-08 ENCOUNTER — Ambulatory Visit (INDEPENDENT_AMBULATORY_CARE_PROVIDER_SITE_OTHER): Payer: BC Managed Care – PPO

## 2021-08-08 DIAGNOSIS — J309 Allergic rhinitis, unspecified: Secondary | ICD-10-CM

## 2021-08-13 ENCOUNTER — Ambulatory Visit: Payer: BC Managed Care – PPO | Admitting: Physical Therapy

## 2021-08-22 ENCOUNTER — Ambulatory Visit (INDEPENDENT_AMBULATORY_CARE_PROVIDER_SITE_OTHER): Payer: BC Managed Care – PPO

## 2021-08-22 ENCOUNTER — Ambulatory Visit: Payer: BC Managed Care – PPO | Admitting: Podiatry

## 2021-08-22 DIAGNOSIS — J309 Allergic rhinitis, unspecified: Secondary | ICD-10-CM

## 2021-09-04 ENCOUNTER — Ambulatory Visit (INDEPENDENT_AMBULATORY_CARE_PROVIDER_SITE_OTHER): Payer: BC Managed Care – PPO

## 2021-09-04 DIAGNOSIS — J309 Allergic rhinitis, unspecified: Secondary | ICD-10-CM

## 2021-09-19 ENCOUNTER — Ambulatory Visit: Payer: BC Managed Care – PPO | Admitting: Podiatry

## 2021-09-30 ENCOUNTER — Ambulatory Visit (INDEPENDENT_AMBULATORY_CARE_PROVIDER_SITE_OTHER): Payer: BC Managed Care – PPO

## 2021-09-30 DIAGNOSIS — J309 Allergic rhinitis, unspecified: Secondary | ICD-10-CM | POA: Diagnosis not present

## 2021-10-06 DIAGNOSIS — J3081 Allergic rhinitis due to animal (cat) (dog) hair and dander: Secondary | ICD-10-CM | POA: Diagnosis not present

## 2021-10-06 NOTE — Progress Notes (Signed)
VIALS MADE. EXP 10-06-22

## 2021-10-07 ENCOUNTER — Other Ambulatory Visit (HOSPITAL_COMMUNITY): Payer: Self-pay

## 2021-10-10 ENCOUNTER — Ambulatory Visit: Payer: BC Managed Care – PPO | Admitting: Podiatry

## 2021-10-17 ENCOUNTER — Ambulatory Visit (INDEPENDENT_AMBULATORY_CARE_PROVIDER_SITE_OTHER): Payer: BC Managed Care – PPO

## 2021-10-17 DIAGNOSIS — J309 Allergic rhinitis, unspecified: Secondary | ICD-10-CM | POA: Diagnosis not present

## 2021-10-31 ENCOUNTER — Ambulatory Visit (INDEPENDENT_AMBULATORY_CARE_PROVIDER_SITE_OTHER): Payer: BC Managed Care – PPO

## 2021-10-31 DIAGNOSIS — J309 Allergic rhinitis, unspecified: Secondary | ICD-10-CM | POA: Diagnosis not present

## 2021-11-14 ENCOUNTER — Ambulatory Visit (INDEPENDENT_AMBULATORY_CARE_PROVIDER_SITE_OTHER): Payer: BC Managed Care – PPO

## 2021-11-14 DIAGNOSIS — J309 Allergic rhinitis, unspecified: Secondary | ICD-10-CM | POA: Diagnosis not present

## 2021-12-05 ENCOUNTER — Other Ambulatory Visit: Payer: Self-pay

## 2021-12-05 ENCOUNTER — Other Ambulatory Visit: Payer: Self-pay | Admitting: Plastic Surgery

## 2021-12-05 ENCOUNTER — Encounter: Payer: Self-pay | Admitting: Podiatry

## 2021-12-05 ENCOUNTER — Ambulatory Visit (INDEPENDENT_AMBULATORY_CARE_PROVIDER_SITE_OTHER): Payer: BC Managed Care – PPO

## 2021-12-05 ENCOUNTER — Ambulatory Visit: Payer: BC Managed Care – PPO | Admitting: Podiatry

## 2021-12-05 DIAGNOSIS — J309 Allergic rhinitis, unspecified: Secondary | ICD-10-CM

## 2021-12-05 DIAGNOSIS — Z79899 Other long term (current) drug therapy: Secondary | ICD-10-CM

## 2021-12-05 DIAGNOSIS — Z1231 Encounter for screening mammogram for malignant neoplasm of breast: Secondary | ICD-10-CM

## 2021-12-05 MED ORDER — CICLOPIROX 8 % EX SOLN: Freq: Every day | CUTANEOUS | 0 refills | Status: DC

## 2021-12-05 NOTE — Progress Notes (Signed)
Subjective: 46 year old female presents the office today for follow-up evaluation of nail fungus.  She states that she has completed the course of Lamisil and has been doing a topical antifungal on her nails.     She relates it is doing a lot better but still does have some fungus present.   Denies any redness or drainage or any swelling.Denies any systemic complaints such as fevers, chills, nausea, vomiting. No acute changes since last appointment, and no other complaints at this time.   Objective: AAO x3, NAD DP/PT pulses palpable bilaterally, CRT less than 3 seconds Overall there is still discoloration of the nails with yellow discoloration.  Hallux nails otherwise affected and they are still hypertrophic and dystrophic as well.  There is no pain in the nails there is no emergency drainage or signs of infection.  No pain with calf compression, swelling, warmth, erythema  Assessment: Onychomycosis  Plan: -All treatment options discussed with the patient including all alternatives, risks, complications.  -Will start another course of the lamisil today.  -LFTs ordered and will prescribed lamisil as long as labs are normal.  -Refill for topical anti-fungal.  -Continue topical medication.    -Return in 3 months for re-evaluation.  -Patient encouraged to call the office with any questions, concerns, change in symptoms.   Lorenda Peck DPM

## 2021-12-06 LAB — HEPATIC FUNCTION PANEL
AG Ratio: 1.5 (calc) (ref 1.0–2.5)
ALT: 18 U/L (ref 6–29)
AST: 19 U/L (ref 10–35)
Albumin: 4.3 g/dL (ref 3.6–5.1)
Alkaline phosphatase (APISO): 61 U/L (ref 31–125)
Bilirubin, Direct: 0.1 mg/dL (ref 0.0–0.2)
Globulin: 2.8 g/dL (calc) (ref 1.9–3.7)
Indirect Bilirubin: 0.4 mg/dL (calc) (ref 0.2–1.2)
Total Bilirubin: 0.5 mg/dL (ref 0.2–1.2)
Total Protein: 7.1 g/dL (ref 6.1–8.1)

## 2021-12-08 ENCOUNTER — Other Ambulatory Visit: Payer: Self-pay | Admitting: Podiatry

## 2021-12-08 MED ORDER — TERBINAFINE HCL 250 MG PO TABS: 250.0000 mg | ORAL_TABLET | Freq: Every day | ORAL | 2 refills | Status: AC

## 2021-12-12 ENCOUNTER — Ambulatory Visit (INDEPENDENT_AMBULATORY_CARE_PROVIDER_SITE_OTHER): Payer: BC Managed Care – PPO

## 2021-12-12 DIAGNOSIS — J309 Allergic rhinitis, unspecified: Secondary | ICD-10-CM

## 2021-12-19 ENCOUNTER — Ambulatory Visit (INDEPENDENT_AMBULATORY_CARE_PROVIDER_SITE_OTHER): Payer: BC Managed Care – PPO

## 2021-12-19 DIAGNOSIS — J309 Allergic rhinitis, unspecified: Secondary | ICD-10-CM

## 2021-12-26 ENCOUNTER — Ambulatory Visit (INDEPENDENT_AMBULATORY_CARE_PROVIDER_SITE_OTHER): Payer: BC Managed Care – PPO | Admitting: *Deleted

## 2021-12-26 DIAGNOSIS — J4550 Severe persistent asthma, uncomplicated: Secondary | ICD-10-CM

## 2021-12-26 DIAGNOSIS — J309 Allergic rhinitis, unspecified: Secondary | ICD-10-CM

## 2022-01-01 ENCOUNTER — Ambulatory Visit (INDEPENDENT_AMBULATORY_CARE_PROVIDER_SITE_OTHER): Payer: BC Managed Care – PPO

## 2022-01-01 DIAGNOSIS — J309 Allergic rhinitis, unspecified: Secondary | ICD-10-CM | POA: Diagnosis not present

## 2022-01-08 ENCOUNTER — Ambulatory Visit: Payer: BC Managed Care – PPO

## 2022-01-09 ENCOUNTER — Ambulatory Visit: Payer: BC Managed Care – PPO

## 2022-01-09 ENCOUNTER — Ambulatory Visit
Admission: RE | Admit: 2022-01-09 | Discharge: 2022-01-09 | Disposition: A | Discharge status: Home / Self Care | Payer: BC Managed Care – PPO | Source: Ambulatory Visit | Attending: Plastic Surgery | Admitting: Plastic Surgery

## 2022-01-09 ENCOUNTER — Other Ambulatory Visit: Payer: Self-pay

## 2022-01-09 DIAGNOSIS — Z1231 Encounter for screening mammogram for malignant neoplasm of breast: Secondary | ICD-10-CM

## 2022-01-13 ENCOUNTER — Other Ambulatory Visit: Payer: Self-pay | Admitting: Plastic Surgery

## 2022-01-13 DIAGNOSIS — R928 Other abnormal and inconclusive findings on diagnostic imaging of breast: Secondary | ICD-10-CM

## 2022-01-15 ENCOUNTER — Ambulatory Visit (INDEPENDENT_AMBULATORY_CARE_PROVIDER_SITE_OTHER): Payer: BC Managed Care – PPO

## 2022-01-15 DIAGNOSIS — J309 Allergic rhinitis, unspecified: Secondary | ICD-10-CM | POA: Diagnosis not present

## 2022-01-30 ENCOUNTER — Ambulatory Visit (INDEPENDENT_AMBULATORY_CARE_PROVIDER_SITE_OTHER): Payer: BC Managed Care – PPO | Admitting: *Deleted

## 2022-01-30 DIAGNOSIS — J309 Allergic rhinitis, unspecified: Secondary | ICD-10-CM | POA: Diagnosis not present

## 2022-02-05 ENCOUNTER — Other Ambulatory Visit: Payer: BC Managed Care – PPO

## 2022-02-06 ENCOUNTER — Ambulatory Visit
Admission: RE | Admit: 2022-02-06 | Discharge: 2022-02-06 | Disposition: A | Discharge status: Home / Self Care | Payer: BC Managed Care – PPO | Source: Ambulatory Visit | Attending: Plastic Surgery | Admitting: Plastic Surgery

## 2022-02-06 ENCOUNTER — Ambulatory Visit: Admission: RE | Admit: 2022-02-06 | Payer: BC Managed Care – PPO | Source: Ambulatory Visit

## 2022-02-06 DIAGNOSIS — R928 Other abnormal and inconclusive findings on diagnostic imaging of breast: Secondary | ICD-10-CM

## 2022-02-09 DIAGNOSIS — J3089 Other allergic rhinitis: Secondary | ICD-10-CM | POA: Diagnosis not present

## 2022-02-09 NOTE — Progress Notes (Signed)
VIALS EXP 02-10-23 ?

## 2022-02-13 ENCOUNTER — Ambulatory Visit (INDEPENDENT_AMBULATORY_CARE_PROVIDER_SITE_OTHER): Payer: BC Managed Care – PPO

## 2022-02-13 DIAGNOSIS — J309 Allergic rhinitis, unspecified: Secondary | ICD-10-CM

## 2022-03-13 ENCOUNTER — Ambulatory Visit (INDEPENDENT_AMBULATORY_CARE_PROVIDER_SITE_OTHER): Payer: BC Managed Care – PPO | Admitting: *Deleted

## 2022-03-13 ENCOUNTER — Ambulatory Visit: Payer: BC Managed Care – PPO | Admitting: Podiatry

## 2022-03-13 DIAGNOSIS — J309 Allergic rhinitis, unspecified: Secondary | ICD-10-CM

## 2022-03-13 MED ORDER — ALBUTEROL SULFATE HFA 108 (90 BASE) MCG/ACT IN AERS: 1.0000 | INHALATION_SPRAY | RESPIRATORY_TRACT | 0 refills | Status: DC | PRN

## 2022-04-02 IMAGING — MG MM DIGITAL SCREENING BILAT W/ TOMO AND CAD
8 series · 8 of 24 positions shown · non-contrast
Comparison: Previous exam(s).

CLINICAL DATA: Screening.

EXAM:
DIGITAL SCREENING BILATERAL MAMMOGRAM WITH TOMOSYNTHESIS AND CAD
TECHNIQUE: Bilateral screening digital craniocaudal and mediolateral oblique
mammograms were obtained. Bilateral screening digital breast
tomosynthesis was performed. The images were evaluated with
computer-aided detection.

[L CC synth-2D]
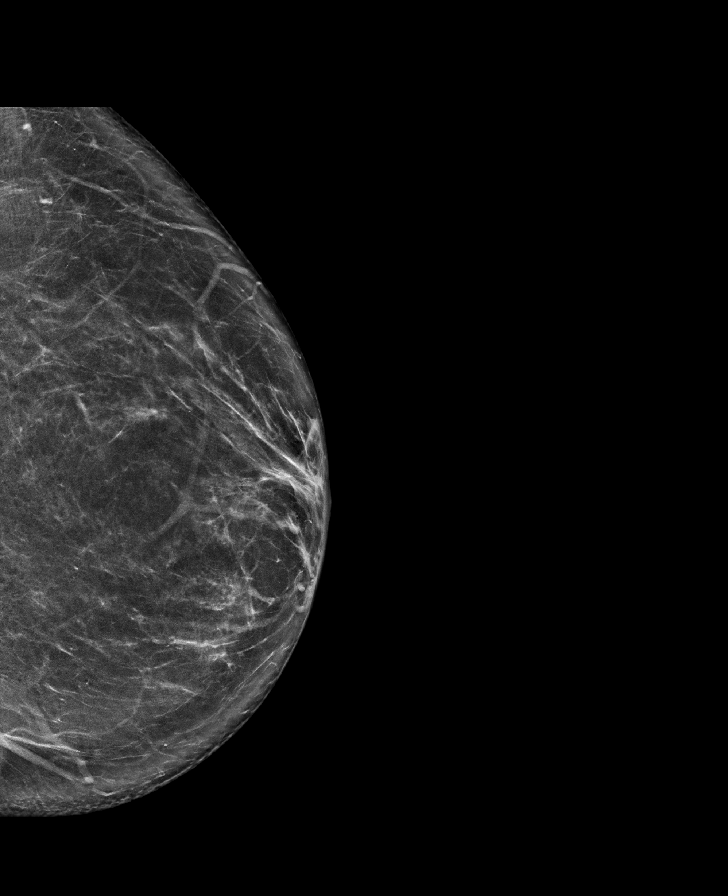

[R MLO synth-2D]
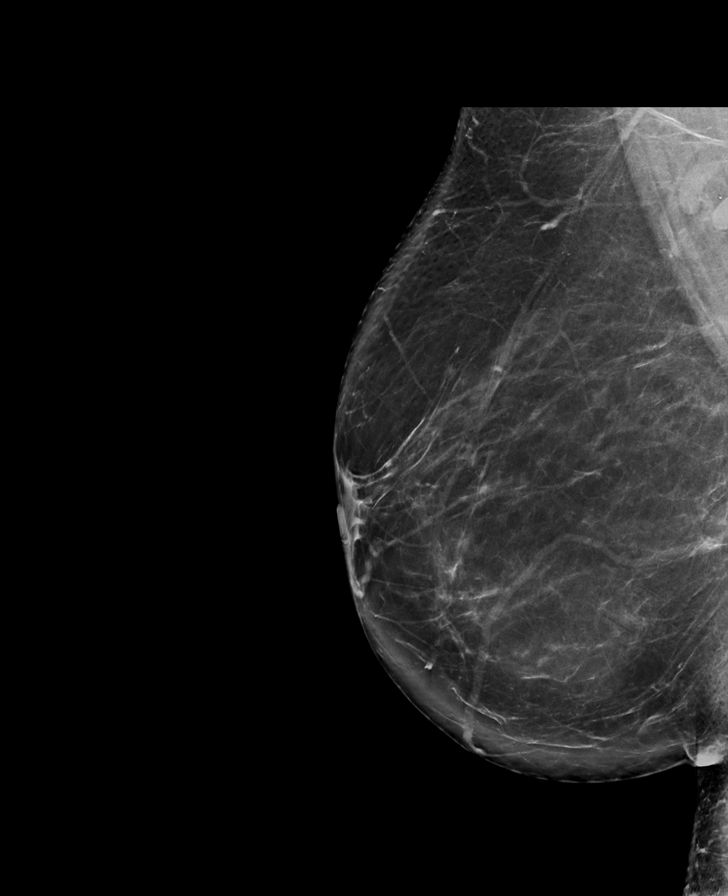

[L MLO synth-2D]
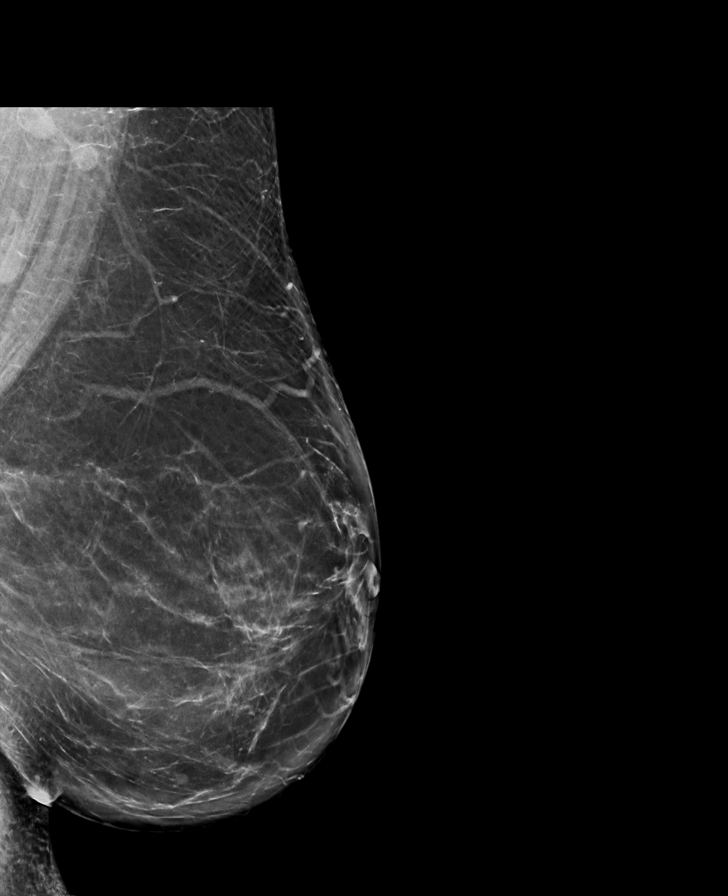

[R CC synth-2D]
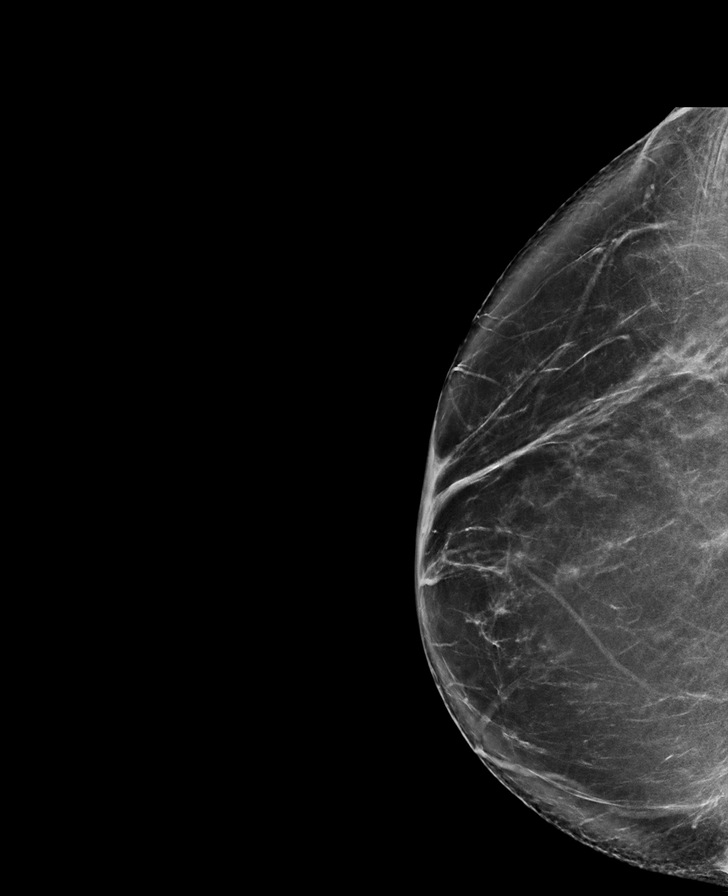

[R CC tomo · tomo slice 45/90.0]
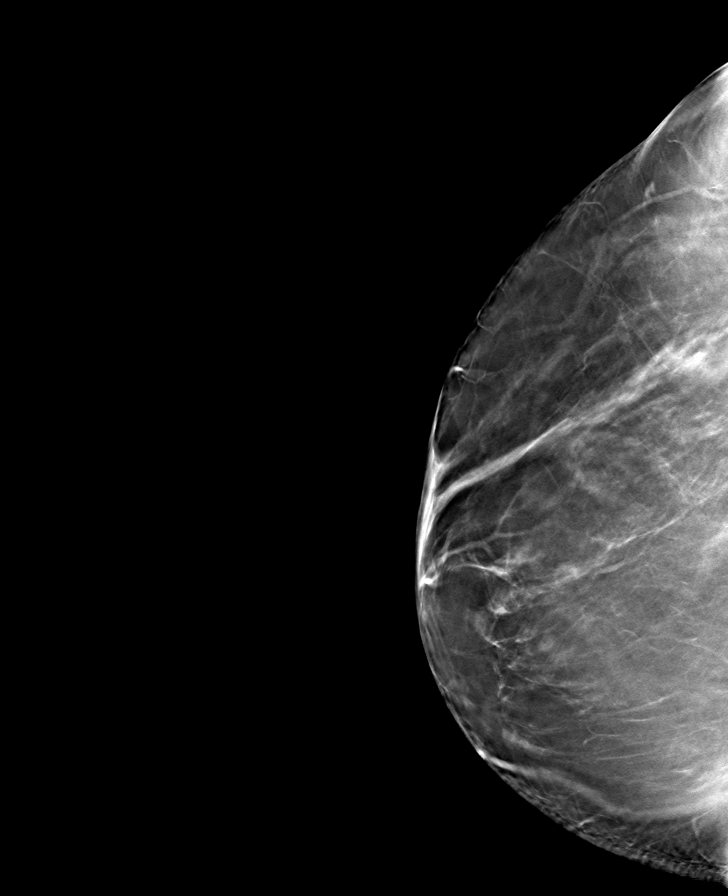

[L CC tomo · tomo slice 41/82.0]
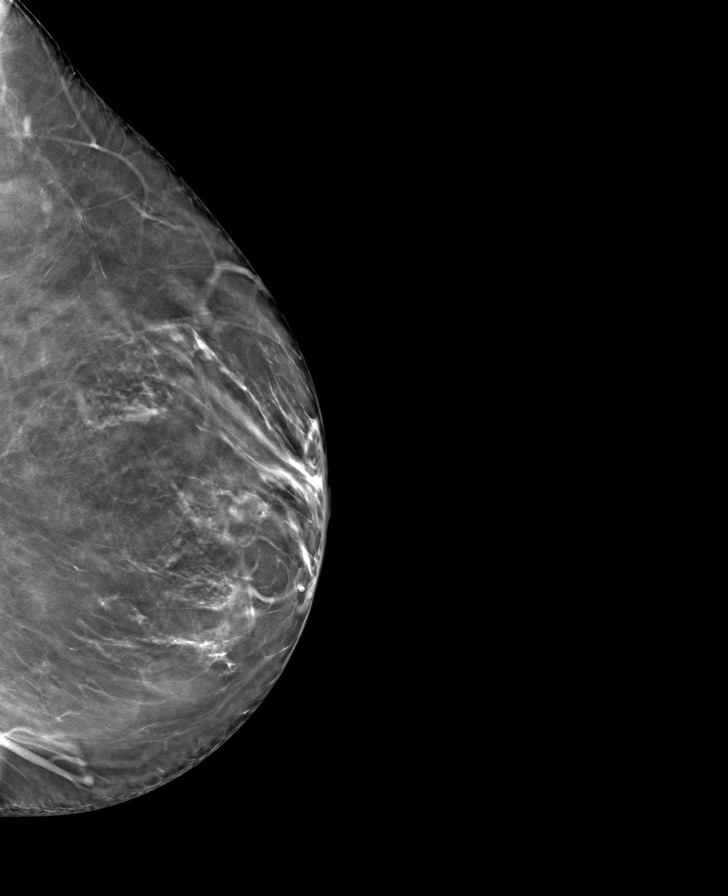

[L MLO tomo · tomo slice 49/96.0]
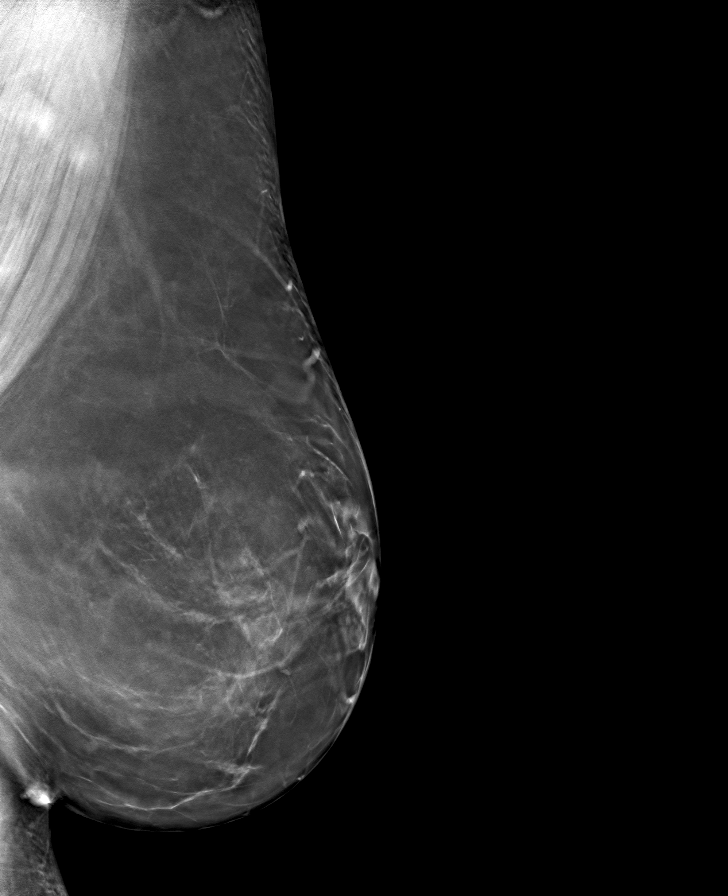

[R MLO tomo · tomo slice 49/98.0]
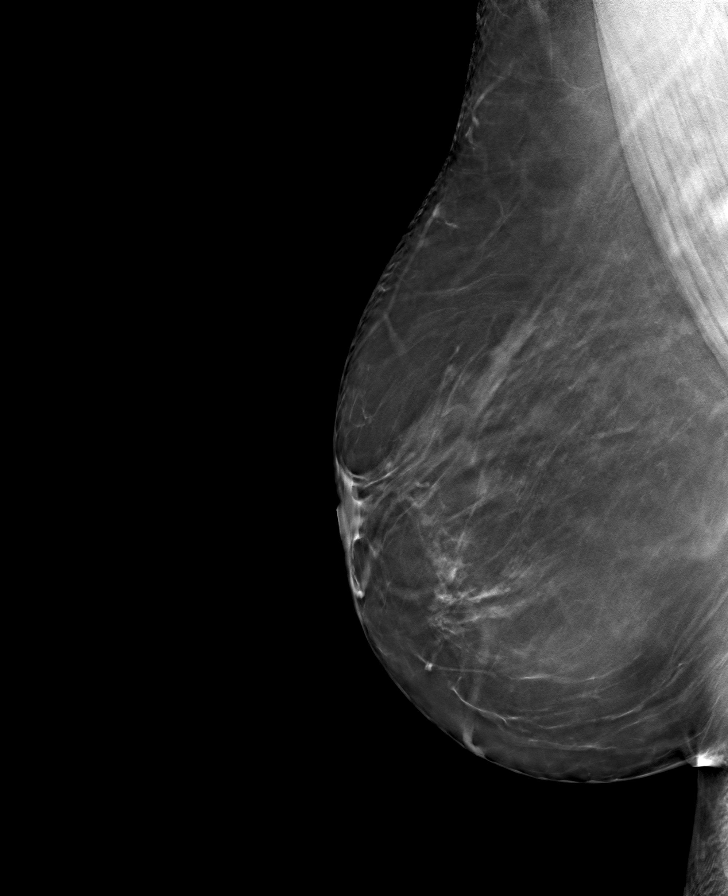

[8 of 24 positions shown; findings below may reference images not displayed]

ACR Breast Density Category b: There are scattered areas of
fibroglandular density.
FINDINGS: There are no findings suspicious for malignancy.
IMPRESSION: No mammographic evidence of malignancy. A result letter of this
screening mammogram will be mailed directly to the patient.

RECOMMENDATION:
Screening mammogram in one year. (Code:51-O-LD2)

BI-RADS CATEGORY  1: Negative.

## 2022-04-09 ENCOUNTER — Encounter: Payer: Self-pay | Admitting: Family Medicine

## 2022-04-09 ENCOUNTER — Ambulatory Visit: Payer: BC Managed Care – PPO | Admitting: Family Medicine

## 2022-04-09 VITALS — BP 126/84 | HR 75 | Temp 97.2°F | Resp 16 | Ht 68.0 in | Wt 233.3 lb

## 2022-04-09 DIAGNOSIS — J309 Allergic rhinitis, unspecified: Secondary | ICD-10-CM | POA: Diagnosis not present

## 2022-04-09 DIAGNOSIS — J4520 Mild intermittent asthma, uncomplicated: Secondary | ICD-10-CM

## 2022-04-09 DIAGNOSIS — H1013 Acute atopic conjunctivitis, bilateral: Secondary | ICD-10-CM | POA: Diagnosis not present

## 2022-04-09 DIAGNOSIS — Z91018 Allergy to other foods: Secondary | ICD-10-CM

## 2022-04-09 DIAGNOSIS — J302 Other seasonal allergic rhinitis: Secondary | ICD-10-CM

## 2022-04-09 MED ORDER — EPINEPHRINE 0.3 MG/0.3ML IJ SOAJ: 0.3000 mg | INTRAMUSCULAR | 1 refills | Status: DC

## 2022-04-09 MED ORDER — AZELASTINE HCL 0.15 % NA SOLN: NASAL | 1 refills | Status: DC

## 2022-04-09 MED ORDER — QVAR REDIHALER 80 MCG/ACT IN AERB: INHALATION_SPRAY | RESPIRATORY_TRACT | 1 refills | Status: DC

## 2022-04-09 MED ORDER — ALBUTEROL SULFATE HFA 108 (90 BASE) MCG/ACT IN AERS: 1.0000 | INHALATION_SPRAY | RESPIRATORY_TRACT | 1 refills | Status: DC | PRN

## 2022-04-09 NOTE — Patient Instructions (Addendum)
Asthma ?Continue albuterol 2 puffs once every 4 hours as needed for cough or wheeze ?You may use albuterol 2 puffs 5 to 15 minutes before activity to decrease cough or wheeze ?For asthma flare, begin Flovent 110-2 puffs twice a day with a spacer for two weeks or until cough and wheeze free. This medication will be on hold at your pharmacy. You will need to call them if you need to fill this prescription ? ?Allergic rhinitis ?Continue allergen avoidance measures directed toward mold, cockroach, cat, dog, dust mite, and weed pollen ?Continue your over the counter antihistamine once a day as needed for runny nose. Remember to rotate to a different antihistamine about every 3 months. Some examples of over the counter antihistamines include Zyrtec (cetirizine), Xyzal (levocetirizine), Allegra (fexofenadine), and Claritin (loratidine).  ?Continue azelastine 2 sprays in each nostril twice a day as needed for runny nose ?Consider saline nasal rinses as needed for nasal symptoms. Use this before any medicated nasal sprays for best result ?Continue allergen immunotherapy and have success to an epinephrine autoinjector set ? ?Allergic conjunctivitis ?Continue Pataday 1 drop in each eye once a day as needed for red itchy eyes ? ?Alpha gal allergy ?Continue to avoid mammalian meat, dairy, and products containing gelatin.  In case of an allergic reaction, take Benadryl 50 mg every 4 hours, and if life-threatening symptoms occur, inject with AuviQ 0.3 mg. ?A lab order has been placed to help [place] evaluate your alpha gal allergy.  We will call you when the results become available ? ?Call the clinic if this treatment plan is not working well for you ? ?Follow up in 6 months or sooner if needed. ? ?

## 2022-04-09 NOTE — Progress Notes (Signed)
? ?[place] [address] ?Dept: [phone] ? ?FOLLOW UP NOTE ? ?Patient ID: Amy Pope, female    DOB: 11-24-1976  Age: 46 y.o. MRN: [id number] ?Date of Office Visit: 04/09/2022 ? ?Assessment  ?Chief Complaint: Asthma (LOV: 09/20/20  Fair) and Allergic Rhinitis  (LOV: 09/20/20  Fairly well) ? ?HPI ?Amy Pope is a 46 year old female who presents to the clinic for follow-up visit.  She was last seen in this clinic on 09/20/2020 by Gareth Morgan, FNP, for evaluation of asthma, allergic rhinitis on allergen immunotherapy, allergic conjunctivitis, alpha gal allergy, and idiopathic anaphylactic reaction.  At today's visit, she reports her asthma has been moderately well controlled with symptoms including occasional shortness of breath and wheeze.  She denies cough with activity or rest.  She continues albuterol 2 puffs once or twice a week with relief of symptoms.  She reports that she has not needed to use Flovent 110 for asthma flare at this time.  Allergic rhinitis is reported as moderately well controlled with symptoms including frequent nasal congestion for which she continues nasal saline rinses as well as azelastine with relief of symptoms.  She continues allergen immunotherapy once every 4 weeks with no large or local reactions.  She reports a significant decrease in her symptoms of allergic rhinitis while continuing on allergen immunotherapy.  She began allergen immunotherapy on 10/05/2019.  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  Allergic conjunctivitis is reported as well controlled with occasional olopatadine use.  She reports 1 incident that occurred about 6 months ago when she developed symptoms including widespread itch, hives, and throat tingling with slight closure for which she took Benadryl with relief of symptoms.  She continues to avoid mammalian meat, however, continues to occasionally eat other mammalian products such as cheese. Her last alpha-gal blood  work was on 09/28/2019 and was positive to alpha gal and beef IgE.  Her EpiPen's are currently out of date.  Her current medications are listed in the chart. ? ?Drug Allergies:  ?Allergies  ?Allergen Reactions  ? Morphine Swelling  ? Morphine And Related   ? Morphine Sulfate Other (See Comments)  ? ? ?Physical Exam: ?BP 126/84   Pulse 75   Temp (!) 97.2 ?F (36.2 ?C)   Resp 16   Ht 5' 8"  (1.727 m)   Wt 233 lb 4.8 oz (105.8 kg)   SpO2 100%   BMI 35.47 kg/m?   ? ?Physical Exam ?Vitals reviewed.  ?Constitutional:   ?   Appearance: Normal appearance.  ?HENT:  ?   Head: Normocephalic and atraumatic.  ?   Right Ear: Tympanic membrane normal.  ?   Left Ear: Tympanic membrane normal.  ?   Nose:  ?   Comments: Bilateral nares slightly erythematous with clear nasal drainage noted.  Pharynx normal.  Ears normal.  Eyes normal. ?   Mouth/Throat:  ?   Pharynx: Oropharynx is clear.  ?Eyes:  ?   Conjunctiva/sclera: Conjunctivae normal.  ?Cardiovascular:  ?   Rate and Rhythm: Normal rate and regular rhythm.  ?   Heart sounds: Normal heart sounds. No murmur heard. ?Pulmonary:  ?   Effort: Pulmonary effort is normal.  ?   Breath sounds: Normal breath sounds.  ?   Comments: Lungs clear to auscultation ?Musculoskeletal:     ?   General: Normal range of motion.  ?   Cervical back: Normal range of motion and neck supple.  ?Skin: ?  General: Skin is warm and dry.  ?Neurological:  ?   Mental Status: She is alert and oriented to person, place, and time.  ?Psychiatric:     ?   Mood and Affect: Mood normal.     ?   Behavior: Behavior normal.     ?   Thought Content: Thought content normal.     ?   Judgment: Judgment normal.  ? ? ?Diagnostics: ?FVC 2.62, FEV1 1.98. Predicted FVC 4.05, predicted FEV1 3.23. Spirometry indicates possible restriction.  This is consistent with previous spirometry readings ? ?Assessment and Plan: ?1. Mild intermittent asthma without complication   ?2. Seasonal and perennial allergic rhinitis   ?3. Allergic  conjunctivitis of both eyes   ?4. Allergy to alpha-gal   ? ? ?Meds ordered this encounter  ?Medications  ? DISCONTD: EPINEPHrine 0.3 mg/0.3 mL IJ SOAJ injection  ?  Sig: Inject 0.3 mg into the muscle See admin instructions.  ?  Dispense:  2 each  ?  Refill:  1  ? albuterol (VENTOLIN HFA) 108 (90 Base) MCG/ACT inhaler  ?  Sig: Inhale 1-2 puffs into the lungs every 4 (four) hours as needed for wheezing or shortness of breath (every 4-6 hours).  ?  Dispense:  18 g  ?  Refill:  1  ? Azelastine HCl 0.15 % SOLN  ?  Sig: Can use two sprays in each nostril twice daily if needed.  ?  Dispense:  90 mL  ?  Refill:  1  ? beclomethasone (QVAR REDIHALER) 80 MCG/ACT inhaler  ?  Sig: ` For asthma flare, begin Qvar 80-2 puffs twice a day for 2 weeks or until cough and wheeze free  ?  Dispense:  10.6 g  ?  Refill:  1  ? EPINEPHrine 0.3 mg/0.3 mL IJ SOAJ injection  ?  Sig: Inject 0.3 mg into the muscle See admin instructions.  ?  Dispense:  2 each  ?  Refill:  1  ? ? ?Patient Instructions  ?Asthma ?Continue albuterol 2 puffs once every 4 hours as needed for cough or wheeze ?You may use albuterol 2 puffs 5 to 15 minutes before activity to decrease cough or wheeze ?For asthma flare, begin Flovent 110-2 puffs twice a day with a spacer for two weeks or until cough and wheeze free. This medication will be on hold at your pharmacy. You will need to call them if you need to fill this prescription ? ?Allergic rhinitis ?Continue allergen avoidance measures directed toward mold, cockroach, cat, dog, dust mite, and weed pollen ?Continue your over the counter antihistamine once a day as needed for runny nose. Remember to rotate to a different antihistamine about every 3 months. Some examples of over the counter antihistamines include Zyrtec (cetirizine), Xyzal (levocetirizine), Allegra (fexofenadine), and Claritin (loratidine).  ?Continue azelastine 2 sprays in each nostril twice a day as needed for runny nose ?Consider saline nasal rinses as  needed for nasal symptoms. Use this before any medicated nasal sprays for best result ?Continue allergen immunotherapy and have success to an epinephrine autoinjector set ? ?Allergic conjunctivitis ?Continue Pataday 1 drop in each eye once a day as needed for red itchy eyes ? ?Alpha gal allergy ?Continue to avoid mammalian meat, dairy, and products containing gelatin.  In case of an allergic reaction, take Benadryl 50 mg every 4 hours, and if life-threatening symptoms occur, inject with AuviQ 0.3 mg. ?A lab order has been placed to help [place] evaluate your alpha gal allergy.  We  will call you when the results become available ? ?Call the clinic if this treatment plan is not working well for you ? ?Follow up in 6 months or sooner if needed. ? ? ?Return in about 6 months (around 10/10/2022), or if symptoms worsen or fail to improve. ?  ? ?Thank you for the opportunity to care for this patient.  Please do not hesitate to contact me with questions. ? ?Gareth Morgan, FNP ?[facility] ? ? ? ? ? ?

## 2022-04-13 LAB — ALPHA-GAL PANEL
Allergen Lamb IgE: 0.12 kU/L — AB
Beef IgE: 0.12 kU/L — AB
IgE (Immunoglobulin E), Serum: 43 IU/mL (ref 6–495)
O215-IgE Alpha-Gal: 0.23 kU/L — AB
Pork IgE: <0.1 kU/L

## 2022-04-13 NOTE — Progress Notes (Signed)
Can you please let this patient know that her alpha gal levels have decreased from her last testing in 2020, however, the testing is not negative. Please have her continue to avoid mammalian meats at this time and have access to an epinephrine auto-injector set. Thank you

## 2022-05-12 ENCOUNTER — Ambulatory Visit: Payer: BC Managed Care – PPO | Admitting: Family

## 2022-05-12 ENCOUNTER — Encounter: Payer: Self-pay | Admitting: Family

## 2022-05-12 VITALS — BP 122/82 | HR 79 | Temp 97.2°F | Resp 18

## 2022-05-12 DIAGNOSIS — Z91018 Allergy to other foods: Secondary | ICD-10-CM

## 2022-05-12 DIAGNOSIS — R21 Rash and other nonspecific skin eruption: Secondary | ICD-10-CM

## 2022-05-12 DIAGNOSIS — J3089 Other allergic rhinitis: Secondary | ICD-10-CM | POA: Diagnosis not present

## 2022-05-12 DIAGNOSIS — H1013 Acute atopic conjunctivitis, bilateral: Secondary | ICD-10-CM | POA: Diagnosis not present

## 2022-05-12 DIAGNOSIS — J4520 Mild intermittent asthma, uncomplicated: Secondary | ICD-10-CM

## 2022-05-12 DIAGNOSIS — J302 Other seasonal allergic rhinitis: Secondary | ICD-10-CM

## 2022-05-12 MED ORDER — AZELASTINE HCL 0.15 % NA SOLN: NASAL | 1 refills | Status: DC

## 2022-05-12 NOTE — Patient Instructions (Addendum)
Rash I am not certain what is causing your rash Start prednisone 10 mg taking 2 tablets twice a day for 3 days, then on the fourth day take 2 tablets in the morning, then on the fifth day take 1 tablet and stop.  Make sure to check your blood sugar while taking this medication. Stop drinking the Premier protein drinks since these contain dairy. Avoid all dairy products Start Allegra 180 mg twice a day until rash goes away Stop Claritin (loratadine) May use over the counter Sarna lotion for itching If your symptoms re-occur, begin a journal of events that occurred for up to 6 hours before your symptoms began including foods and beverages consumed, soaps or perfumes you had contact with, and medications.    Asthma Continue albuterol 2 puffs once every 4 hours as needed for cough or wheeze You may use albuterol 2 puffs 5 to 15 minutes before activity to decrease cough or wheeze For asthma flare, begin Flovent 110-2 puffs twice a day with a spacer for two weeks or until cough and wheeze free. This medication will be on hold at your pharmacy. You will need to call them if you need to fill this prescription   Allergic rhinitis Continue allergen avoidance measures directed toward mold, cockroach, cat, dog, dust mite, and weed pollen Continue your over the counter antihistamine once a day as needed for runny nose. Remember to rotate to a different antihistamine about every 3 months. Some examples of over the counter antihistamines include Zyrtec (cetirizine), Xyzal (levocetirizine), Allegra (fexofenadine), and Claritin (loratidine).  Continue azelastine 2 sprays in each nostril twice a day as needed for runny nose Consider saline nasal rinses as needed for nasal symptoms. Use this before any medicated nasal sprays for best result Continue allergen immunotherapy and have success to an epinephrine autoinjector set. Do not get your allergy injections this week. Wait until your rash goes away  Allergic  conjunctivitis Continue Pataday 1 drop in each eye once a day as needed for red itchy eyes  Alpha gal allergy Continue to avoid mammalian meat, dairy, and products containing gelatin.  In case of an allergic reaction, take Benadryl 50 mg every 4 hours, and if life-threatening symptoms occur, inject with AuviQ 0.3 mg.   Call the clinic if this treatment plan is not working well for you  Follow up in 4 months or sooner if needed.

## 2022-05-12 NOTE — Progress Notes (Signed)
[place] [id number] Dept: [phone]  FOLLOW UP NOTE  Patient ID: Amy Pope, female    DOB: September 19, 1976  Age: 46 y.o. MRN: [id number] Date of Office Visit: 05/12/2022  Assessment  Chief Complaint: Rash  HPI Amy Pope is a 46 year old female who presents today for an acute visit of rash.  She was last seen on Apr 09, 2022 by Gareth Morgan, FNP for mild intermittent asthma without complication, seasonal and perennial allergic rhinitis, allergic conjunctivitis, and allergy to alpha gal.  She denies any new diagnosis or surgery since her last office visit.  She reports that last Wednesday while on vacation she started having tingling in her hands and feet and then developed a rash that is always been on her arms and back, but moves around on her legs.  The rash is itchy and not painful.  She has tried taking Allegra in the morning, Claritin at night, and Benadryl during the day. She has also tried using rubbing alcohol, vinegar, and cortisone cream.  She just recently switched to drinking vanilla Premier protein drinks.  Prior to this she was drinking chocolate Premier protein drinks that she will typically drink about 2 protein drinks a day, but did not start drinking these consistently until the past month.  [place] bit [place] ivy and it may have touched her arm and her leg.  She also recently started Ozempic a month ago for prediabetes.  She denies any fever or joint pain.  No one else in the household has this rash.  She denies any bug bites before the rash occurred.  When the rash on her legs moves around it does not leave any residual bruising.  The tingling on her arms and legs have only occurred a couple times.  She is not using any new products.   Asthma: She reports that 1 day this week she felt like she could not get a good breath.  She denies coughing, wheezing, tightness in chest, and nocturnal  awakenings due to breathing problems.  She uses her albuterol maybe once every other week.  Since her last office visit she has not required any systemic steroids or made any trips to the emergency room or urgent care due to breathing problems.  She has not had to use her Flovent 110 mcg for asthma flares since we last saw her.  Seasonal and perennial allergic rhinitis is reported as controlled right now with Allegra in the morning, Claritin at night, Benadryl as needed during the day, Flonase nasal spray as needed, and allergy injections per protocol.  She is currently out of azelastine nasal spray and feels like this best helps open her up when she has sinus pressure.  She does feel like her allergy injections help.  Allergic conjunctivitis is reported as moderately controlled with over-the-counter eyedrops.  She reports occasional itchy watery eyes.  She continues to avoid mammalian meat, but does eat protein shakes that contain dairy and will occasionally eat mozzarella cheese sticks.  She cannot eat too much cheese because it messes with her GI tract.  She also has not been avoiding gelatin.  She does take gel capsules, but has not had any in the past week.  In the past when she is taken gelatin type medications they have not bothered her.  After looking at the ingredients of her Premier protein shakes they do contain dairy.  She has been drinking these consistently more for the past month.  Instructed her to stop these protein drinks and to find a protein drink that does not contain dairy.  She also has an appointment this week with a nutritionist.  Her epinephrine autoinjector is up to date    Drug Allergies:  Allergies  Allergen Reactions   Morphine Swelling   Morphine And Related    Morphine Sulfate Other (See Comments)    Review of Systems: Review of Systems  Constitutional:  Negative for chills and fever.  HENT:         Denies rhinorrhea, nasal congestion, and postnasal drip  Eyes:         Reports itchy watery eyes at times.  Has over-the-counter eyedrops that she uses  Respiratory:  Negative for cough and wheezing.        Reports that she used her albuterol once this week because she felt like she could not get a good breath.  Otherwise denies cough, wheeze, tightness in chest, and nocturnal awakenings due to breathing problems  Cardiovascular:  Positive for palpitations. Negative for chest pain.       Reports palpitations at times and reports that it is hard to distinguish with anxiety.  She is spoken with her primary care physician about this.  Musculoskeletal:  Negative for joint pain.  Skin:  Positive for itching and rash.       Reports itchy rash since last Wednesday  Neurological:  Positive for tingling.  Endo/Heme/Allergies:  Positive for environmental allergies.     Physical Exam: BP 122/82   Pulse 79   Temp (!) 97.2 F (36.2 C) (Temporal)   Resp 18   SpO2 100%    Physical Exam Constitutional:      Appearance: Normal appearance.  HENT:     Head: Normocephalic and atraumatic.     Comments: Pharynx normal, eyes normal, ears normal, nose: Bilateral lower turbinates mildly edematous and slightly erythematous with clear drainage noted    Right Ear: Tympanic membrane, ear canal and external ear normal.     Left Ear: Tympanic membrane, ear canal and external ear normal.     Mouth/Throat:     Mouth: Mucous membranes are moist.     Pharynx: Oropharynx is clear.  Eyes:     Conjunctiva/sclera: Conjunctivae normal.  Cardiovascular:     Rate and Rhythm: Normal rate and regular rhythm.     Heart sounds: Normal heart sounds.  Pulmonary:     Effort: Pulmonary effort is normal.     Breath sounds: Normal breath sounds.     Comments: Lungs clear to auscultation Musculoskeletal:     Cervical back: Neck supple.  Skin:    General: Skin is warm.     Comments: Fine papular slightly erythematous nonlinear rash with scabbing noted on bilateral legs, upper arms, and  bilateral shoulders.  Neurological:     Mental Status: She is alert and oriented to person, place, and time.  Psychiatric:        Mood and Affect: Mood normal.        Behavior: Behavior normal.        Thought Content: Thought content normal.        Judgment: Judgment normal.     Diagnostics:  none  Assessment and Plan: 1. Rash and other nonspecific skin eruption   2. Mild intermittent asthma, unspecified whether complicated   3. Allergy to alpha-gal   4. Seasonal and perennial allergic rhinitis   5.  Allergic conjunctivitis of both eyes     Meds ordered this encounter  Medications   Azelastine HCl 0.15 % SOLN    Sig: Can use two sprays in each nostril twice daily if needed.    Dispense:  90 mL    Refill:  1    Patient Instructions  Rash I am not certain what is causing your rash Start prednisone 10 mg taking 2 tablets twice a day for 3 days, then on the fourth day take 2 tablets in the morning, then on the fifth day take 1 tablet and stop.  Make sure to check your blood sugar while taking this medication. Stop drinking the Premier protein drinks since these contain dairy. Avoid all dairy products Start Allegra 180 mg twice a day until rash goes away Stop Claritin (loratadine) May use over the counter Sarna lotion for itching If your symptoms re-occur, begin a journal of events that occurred for up to 6 hours before your symptoms began including foods and beverages consumed, soaps or perfumes you had contact with, and medications.    Asthma Continue albuterol 2 puffs once every 4 hours as needed for cough or wheeze You may use albuterol 2 puffs 5 to 15 minutes before activity to decrease cough or wheeze For asthma flare, begin Flovent 110-2 puffs twice a day with a spacer for two weeks or until cough and wheeze free. This medication will be on hold at your pharmacy. You will need to call them if you need to fill this prescription   Allergic rhinitis Continue allergen  avoidance measures directed toward mold, cockroach, cat, dog, dust mite, and weed pollen Continue your over the counter antihistamine once a day as needed for runny nose. Remember to rotate to a different antihistamine about every 3 months. Some examples of over the counter antihistamines include Zyrtec (cetirizine), Xyzal (levocetirizine), Allegra (fexofenadine), and Claritin (loratidine).  Continue azelastine 2 sprays in each nostril twice a day as needed for runny nose Consider saline nasal rinses as needed for nasal symptoms. Use this before any medicated nasal sprays for best result Continue allergen immunotherapy and have success to an epinephrine autoinjector set. Do not get your allergy injections this week. Wait until your rash goes away  Allergic conjunctivitis Continue Pataday 1 drop in each eye once a day as needed for red itchy eyes  Alpha gal allergy Continue to avoid mammalian meat, dairy, and products containing gelatin.  In case of an allergic reaction, take Benadryl 50 mg every 4 hours, and if life-threatening symptoms occur, inject with AuviQ 0.3 mg.   Call the clinic if this treatment plan is not working well for you  Follow up in 4 months or sooner if needed. Return in about 4 months (around 09/11/2022), or if symptoms worsen or fail to improve.    Thank you for the opportunity to care for this patient.  Please do not hesitate to contact me with questions.  Althea Charon, FNP [facility]

## 2022-05-22 ENCOUNTER — Ambulatory Visit (INDEPENDENT_AMBULATORY_CARE_PROVIDER_SITE_OTHER): Payer: BC Managed Care – PPO | Admitting: *Deleted

## 2022-05-22 DIAGNOSIS — J309 Allergic rhinitis, unspecified: Secondary | ICD-10-CM | POA: Diagnosis not present

## 2022-05-29 ENCOUNTER — Ambulatory Visit (INDEPENDENT_AMBULATORY_CARE_PROVIDER_SITE_OTHER): Payer: BC Managed Care – PPO

## 2022-05-29 DIAGNOSIS — J309 Allergic rhinitis, unspecified: Secondary | ICD-10-CM | POA: Diagnosis not present

## 2022-06-11 ENCOUNTER — Ambulatory Visit (INDEPENDENT_AMBULATORY_CARE_PROVIDER_SITE_OTHER): Payer: BC Managed Care – PPO

## 2022-06-11 DIAGNOSIS — J309 Allergic rhinitis, unspecified: Secondary | ICD-10-CM | POA: Diagnosis not present

## 2022-06-30 ENCOUNTER — Ambulatory Visit (INDEPENDENT_AMBULATORY_CARE_PROVIDER_SITE_OTHER): Payer: BC Managed Care – PPO

## 2022-06-30 DIAGNOSIS — J309 Allergic rhinitis, unspecified: Secondary | ICD-10-CM | POA: Diagnosis not present

## 2022-07-10 ENCOUNTER — Ambulatory Visit (INDEPENDENT_AMBULATORY_CARE_PROVIDER_SITE_OTHER): Payer: BC Managed Care – PPO | Admitting: *Deleted

## 2022-07-10 DIAGNOSIS — J309 Allergic rhinitis, unspecified: Secondary | ICD-10-CM | POA: Diagnosis not present

## 2022-07-17 ENCOUNTER — Ambulatory Visit (INDEPENDENT_AMBULATORY_CARE_PROVIDER_SITE_OTHER): Payer: BC Managed Care – PPO | Admitting: *Deleted

## 2022-07-17 DIAGNOSIS — J309 Allergic rhinitis, unspecified: Secondary | ICD-10-CM

## 2022-08-14 ENCOUNTER — Ambulatory Visit (INDEPENDENT_AMBULATORY_CARE_PROVIDER_SITE_OTHER): Payer: BC Managed Care – PPO

## 2022-08-14 DIAGNOSIS — J309 Allergic rhinitis, unspecified: Secondary | ICD-10-CM

## 2022-08-24 DIAGNOSIS — J3089 Other allergic rhinitis: Secondary | ICD-10-CM | POA: Diagnosis not present

## 2022-08-24 NOTE — Progress Notes (Signed)
VIALS EXP 08-25-23 

## 2022-09-11 ENCOUNTER — Ambulatory Visit: Payer: BC Managed Care – PPO | Admitting: Internal Medicine

## 2022-09-11 ENCOUNTER — Other Ambulatory Visit: Payer: Self-pay

## 2022-09-11 ENCOUNTER — Encounter: Payer: Self-pay | Admitting: Internal Medicine

## 2022-09-11 VITALS — BP 128/72 | HR 73 | Temp 98.7°F | Resp 18 | Wt 226.1 lb

## 2022-09-11 DIAGNOSIS — J3089 Other allergic rhinitis: Secondary | ICD-10-CM

## 2022-09-11 DIAGNOSIS — T7800XD Anaphylactic reaction due to unspecified food, subsequent encounter: Secondary | ICD-10-CM | POA: Diagnosis not present

## 2022-09-11 DIAGNOSIS — T7800XA Anaphylactic reaction due to unspecified food, initial encounter: Secondary | ICD-10-CM

## 2022-09-11 DIAGNOSIS — H1045 Other chronic allergic conjunctivitis: Secondary | ICD-10-CM

## 2022-09-11 DIAGNOSIS — J4540 Moderate persistent asthma, uncomplicated: Secondary | ICD-10-CM

## 2022-09-11 DIAGNOSIS — H1013 Acute atopic conjunctivitis, bilateral: Secondary | ICD-10-CM

## 2022-09-11 DIAGNOSIS — J4541 Moderate persistent asthma with (acute) exacerbation: Secondary | ICD-10-CM

## 2022-09-11 MED ORDER — BUDESONIDE-FORMOTEROL FUMARATE 160-4.5 MCG/ACT IN AERO: 2.0000 | INHALATION_SPRAY | Freq: Two times a day (BID) | RESPIRATORY_TRACT | 6 refills | Status: DC

## 2022-09-11 MED ORDER — AZELASTINE HCL 0.1 % NA SOLN: 2.0000 | Freq: Two times a day (BID) | NASAL | 12 refills | Status: DC

## 2022-09-11 NOTE — Patient Instructions (Addendum)
Moderate persistent asthma: not well controlled with acute exacerbation  - Breathing test today showed: Significant inflammation in your lungs -Prednisone 10mg  tablet pack: 2 tablets given in office today. Take 2 more tablets before bed today.  Then take 2 tablets twice a day for 2 more days. Then take 2 tablets once a day for 1 day. Then take 1 tablet once a day for 1 day.    PLAN:  - Spacer use reviewed. - Daily controller medication(s): Singulair 10mg  daily and Symbicort 160/4.49mcg two puffs twice daily with spacer - Prior to physical activity: albuterol 2 puffs 10-15 minutes before physical activity. - Rescue medications: albuterol 4 puffs every 4-6 hours as needed - Changes during respiratory infections or worsening symptoms: Increase Symbicort to 4 puffs twice daily for TWO WEEKS. - Asthma control goals:  * Full participation in all desired activities (may need albuterol before activity) * Albuterol use two time or less a week on average (not counting use with activity) * Cough interfering with sleep two time or less a month * Oral steroids no more than once a year * No hospitalizations    Allergic rhinitis Continue allergen avoidance measures directed toward mold, cockroach, cat, dog, dust mite, and weed pollen Continue OTC antihistamines as needed  Continue azelastine 2 sprays in each nostril twice a day as needed for runny nose Consider saline nasal rinses as needed for nasal symptoms. Use this before any medicated nasal sprays for best result Continue allergen immunotherapy and have success to an epinephrine autoinjector set.   Allergic conjunctivitis Continue Pataday 1 drop in each eye once a day as needed for red itchy eyes  Alpha gal allergy Continue to avoid mammalian meat, dairy, and products containing gelatin.  In case of an allergic reaction, take Benadryl 50 mg every 4 hours, and if life-threatening symptoms occur, inject with AuviQ 0.3 mg.   Call the clinic  if this treatment plan is not working well for you  Follow up: 4 weeks   Thank you so much for letting me partake in your care today.  Don't hesitate to reach out if you have any additional concerns!  [name], MD  [facility] [place]

## 2022-09-11 NOTE — Progress Notes (Addendum)
Follow Up Note  RE: Amy Pope MRN: [id number] DOB: 1976-10-07 Date of Office Visit: 09/11/2022  Referring provider: Bonnita Nasuti, MD Primary care provider: Bonnita Nasuti, MD  Chief Complaint: Follow-up and Nasal Congestion (Follow up asthma is a mess not well controlled and sinus issues)  History of Present Illness: I had the pleasure of seeing Amy Pope for a follow up visit at the [facility] [place] [facility] [place] on 09/11/2022. She is a 46 y.o. female, who is being followed for allergic rhinitis on AIT, alpha gal disease and asthma  . Her previous allergy office visit was on 05/12/22 with Althea Charon FNP. Today is a regular follow up visit.  History obtained from patient, chart review.  Allergic Rhinitis - Medical therapy: astelin, zyrtec  - Symptoms: she  - Adverse effects of medications: denies  - Allergy testing history: positive to  mold, cockroach, cat, dog, dust mite, and weed pollen - Immunotherapy: Vial 1 (m-cr), vial 2 (w-dm-c-d) - Large Local Reactions: denies  - Systemic Reactions: denies  - Beta Blockers: denies  - History of Reflux denies  - History of Sinus Surgery denies    Food Allergy: continues to avoid red meat and milk, can tolerate small amounts of cheese, yogurt -denies  accidental exposures - denies use of epinephrine -Previous testing: 04/09/2022: Alpha-gal 0.23, beef 0.12, clam 0.12   ASTHMA - Medical therapy:Qvar 80mg  2 puffs twice daily (intermittent use), albuterol Hfa and nebulizer - Rescue inhaler use: daily  - Symptoms: daily cough, wheeze, dyspnea  - Exacerbation history: 2 ABX for respiratory illness since last visit, 1 OCS, 0 ED, 1 UC visits in the past year  - ACT: 18 /25 - Adverse effects of medication: denies  - Previous FEV1: 1.98 L,  - Biologic Labs not done     Assessment and Plan: Amy Pope is a 46 y.o. female with: Moderate persistent asthma with (acute) exacerbation - Plan: Spirometry with Graph  Other allergic  rhinitis  Other chronic allergic conjunctivitis of both eyes  Allergy with anaphylaxis due to food Plan: Patient Instructions  Moderate persistent asthma: not well controlled with acute exacerbation  - Breathing test today showed: Significant inflammation in your lungs -Prednisone 10mg  tablet pack: 2 tablets given in office today. Take 2 more tablets before bed today.  Then take 2 tablets twice a day for 2 more days. Then take 2 tablets once a day for 1 day. Then take 1 tablet once a day for 1 day.    PLAN:  - Spacer use reviewed. - Daily controller medication(s): Singulair 10mg  daily and Symbicort 160/4.44mcg two puffs twice daily with spacer - Prior to physical activity: albuterol 2 puffs 10-15 minutes before physical activity. - Rescue medications: albuterol 4 puffs every 4-6 hours as needed - Changes during respiratory infections or worsening symptoms: Increase Symbicort to 4 puffs twice daily for TWO WEEKS. - Asthma control goals:  * Full participation in all desired activities (may need albuterol before activity) * Albuterol use two time or less a week on average (not counting use with activity) * Cough interfering with sleep two time or less a month * Oral steroids no more than once a year * No hospitalizations    Allergic rhinitis Continue allergen avoidance measures directed toward mold, cockroach, cat, dog, dust mite, and weed pollen Continue OTC antihistamines as needed  Continue azelastine 2 sprays in each nostril twice a day as needed for runny nose Consider saline nasal rinses as needed for  nasal symptoms. Use this before any medicated nasal sprays for best result Continue allergen immunotherapy and have success to an epinephrine autoinjector set.   Allergic conjunctivitis Continue Pataday 1 drop in each eye once a day as needed for red itchy eyes  Alpha gal allergy Continue to avoid mammalian meat, dairy, and products containing gelatin.  In case of an allergic  reaction, take Benadryl 50 mg every 4 hours, and if life-threatening symptoms occur, inject with AuviQ 0.3 mg.   Call the clinic if this treatment plan is not working well for you  Follow up: 4 weeks   Thank you so much for letting me partake in your care today.  Don't hesitate to reach out if you have any additional concerns!  [name], MD  [facility] [place] [place]  No follow-ups on file.  Meds ordered this encounter  Medications   budesonide-formoterol (SYMBICORT) 160-4.5 MCG/ACT inhaler    Sig: Inhale 2 puffs into the lungs in the morning and at bedtime.    Dispense:  1 each    Refill:  6   azelastine (ASTELIN) 0.1 % nasal spray    Sig: Place 2 sprays into both nostrils 2 (two) times daily. Use in each nostril as directed    Dispense:  30 mL    Refill:  12    Lab Orders  No laboratory test(s) ordered today   Diagnostics: Spirometry:  Tracings reviewed. Her effort: Good reproducible efforts. FVC: 2.48 L FEV1: 1.88 L, 58% predicted FEV1/FVC ratio: 76% Interpretation: Spirometry consistent with mixed obstructive and restrictive disease.  After 4 puffs of albuterol there is no significant postbronchodilator response. Please see scanned spirometry results for details.   Results interpreted by myself during this encounter and discussed with patient/family.   Medication List:  Current Outpatient Medications  Medication Sig Dispense Refill   acetaminophen (TYLENOL) 500 MG tablet Take by mouth.     albuterol (VENTOLIN HFA) 108 (90 Base) MCG/ACT inhaler Inhale 1-2 puffs into the lungs every 4 (four) hours as needed for wheezing or shortness of breath (every 4-6 hours). 18 g 1   ALPRAZolam (XANAX) 1 MG tablet Take 1 tablet by mouth 2 (two) times daily.     azelastine (ASTELIN) 0.1 % nasal spray Place 2 sprays into both nostrils 2 (two) times daily. Use in each nostril as directed 30 mL 12   Azelastine HCl 0.15 % SOLN Can use two sprays in each nostril  twice daily if needed. 90 mL 1   budesonide-formoterol (SYMBICORT) 160-4.5 MCG/ACT inhaler Inhale 2 puffs into the lungs in the morning and at bedtime. 1 each 6   ciclopirox (PENLAC) 8 % solution Apply topically at bedtime. Apply over nail and surrounding skin. Apply daily over previous coat. After seven (7) days, may remove with alcohol and continue cycle. 6.6 mL 0   cyclobenzaprine (FLEXERIL) 10 MG tablet 1 tablet as needed     EPINEPHrine 0.3 mg/0.3 mL IJ SOAJ injection Inject 0.3 mg into the muscle See admin instructions. 2 each 1   esomeprazole (NEXIUM) 20 MG capsule Take by mouth.     ibuprofen (ADVIL,MOTRIN) 800 MG tablet Take 800 mg by mouth every 8 (eight) hours as needed (duexis).     meloxicam (MOBIC) 15 MG tablet 1 tablet     Multiple Vitamin (MULTIVITAMIN) tablet Take 1 tablet by mouth daily.     Omega-3 Fatty Acids (FISH OIL) 1000 MG CAPS 1 capsule     Semaglutide,0.25 or 0.5MG /DOS, (OZEMPIC,  0.25 OR 0.5 MG/DOSE,) 2 MG/3ML SOPN Inject 0.25 mg into the skin once a week.     beclomethasone (QVAR REDIHALER) 80 MCG/ACT inhaler ` For asthma flare, begin Qvar 80-2 puffs twice a day for 2 weeks or until cough and wheeze free (Patient not taking: Reported on 05/12/2022) 10.6 g 1   levocetirizine (XYZAL) 5 MG tablet Take 1 tablet (5 mg total) by mouth daily. (Patient not taking: Reported on 05/12/2022) 30 tablet 0   No current facility-administered medications for this visit.   Allergies: Allergies  Allergen Reactions   Morphine Swelling   Morphine And Related    Morphine Sulfate Other (See Comments)   I reviewed her past medical history, social history, family history, and environmental history and no significant changes have been reported from her previous visit.  ROS: All others negative except as noted per HPI.   Objective: BP 128/72 (BP Location: Left Arm, Patient Position: Sitting, Cuff Size: Normal)   Pulse 73   Temp 98.7 F (37.1 C) (Temporal)   Resp 18   Wt 226 lb 1.6 oz  (102.6 kg)   SpO2 98%   BMI 34.38 kg/m  Body mass index is 34.38 kg/m. General Appearance:  Alert, cooperative, no distress, appears stated age  Head:  Normocephalic, without obvious abnormality, atraumatic  Eyes:  Conjunctiva clear, EOM's intact  Nose: Nares normal, hypertrophic turbinates, no visible anterior polyps, and septum midline  Throat: Lips, tongue normal; teeth and gums normal, normal posterior oropharynx and no tonsillar exudate  Neck: Supple, symmetrical  Lungs:   clear to auscultation bilaterally, Respirations unlabored, intermittent dry coughing  Heart:  regular rate and rhythm and no murmur, Appears well perfused  Extremities: No edema  Skin: Skin color, texture, turgor normal, no rashes or lesions on visualized portions of skin   Neurologic: No gross deficits   Previous notes and tests were reviewed. The plan was reviewed with the patient/family, and all questions/concerned were addressed.  It was my pleasure to see Amy Pope today and participate in her care. Please feel free to contact me with any questions or concerns.  Sincerely,  [name], MD  Allergy & Immunology  [facility] Office: [phone]

## 2022-09-25 ENCOUNTER — Ambulatory Visit (INDEPENDENT_AMBULATORY_CARE_PROVIDER_SITE_OTHER): Payer: BC Managed Care – PPO

## 2022-09-25 DIAGNOSIS — J309 Allergic rhinitis, unspecified: Secondary | ICD-10-CM | POA: Diagnosis not present

## 2022-10-01 ENCOUNTER — Ambulatory Visit (INDEPENDENT_AMBULATORY_CARE_PROVIDER_SITE_OTHER): Payer: BC Managed Care – PPO

## 2022-10-01 DIAGNOSIS — J309 Allergic rhinitis, unspecified: Secondary | ICD-10-CM

## 2022-10-14 ENCOUNTER — Ambulatory Visit: Payer: BC Managed Care – PPO | Admitting: Internal Medicine

## 2022-10-21 ENCOUNTER — Ambulatory Visit: Payer: BC Managed Care – PPO | Admitting: Internal Medicine

## 2022-11-04 ENCOUNTER — Ambulatory Visit: Payer: BC Managed Care – PPO | Admitting: Internal Medicine

## 2022-11-04 ENCOUNTER — Encounter: Payer: Self-pay | Admitting: Internal Medicine

## 2022-11-04 VITALS — BP 120/80 | HR 79 | Temp 97.8°F | Resp 18

## 2022-11-04 DIAGNOSIS — Z91018 Allergy to other foods: Secondary | ICD-10-CM

## 2022-11-04 DIAGNOSIS — H1045 Other chronic allergic conjunctivitis: Secondary | ICD-10-CM

## 2022-11-04 DIAGNOSIS — J309 Allergic rhinitis, unspecified: Secondary | ICD-10-CM | POA: Diagnosis not present

## 2022-11-04 DIAGNOSIS — J3089 Other allergic rhinitis: Secondary | ICD-10-CM

## 2022-11-04 DIAGNOSIS — J4540 Moderate persistent asthma, uncomplicated: Secondary | ICD-10-CM

## 2022-11-04 MED ORDER — MONTELUKAST SODIUM 10 MG PO TABS: 10.0000 mg | ORAL_TABLET | Freq: Every day | ORAL | 5 refills | Status: DC

## 2022-11-04 MED ORDER — BREZTRI AEROSPHERE 160-9-4.8 MCG/ACT IN AERO: 2.0000 | INHALATION_SPRAY | Freq: Two times a day (BID) | RESPIRATORY_TRACT | 5 refills | Status: DC

## 2022-11-04 NOTE — Patient Instructions (Addendum)
Moderate persistent asthma: not well controlled Based on recurrent exacerbation we need to change your asthma care  This replaces your symbicort: Start Breztri 160 mcg 2 puffs twice daily and singulair 10mg  daily  We will get spirometry at next visit    PLAN:  - Spacer use reviewed. - Daily controller medication(s):  Breztri 160 mcg 2 puffs twice daily and Singulair 10mg  daily - Prior to physical activity: albuterol 2 puffs 10-15 minutes before physical activity. - Rescue medications: albuterol 4 puffs every 4-6 hours as needed - Changes during respiratory infections or worsening symptoms: Add on Symbicort to 2 puffs twice daily for TWO WEEKS. - Asthma control goals:  * Full participation in all desired activities (may need albuterol before activity) * Albuterol use two time or less a week on average (not counting use with activity) * Cough interfering with sleep two time or less a month * Oral steroids no more than once a year * No hospitalizations    Allergic rhinitis Continue allergen avoidance measures directed toward mold, cockroach, cat, dog, dust mite, and weed pollen Continue OTC antihistamines as needed  Continue azelastine 2 sprays in each nostril twice a day as needed for runny nose Consider saline nasal rinses as needed for nasal symptoms. Use this before any medicated nasal sprays for best result Continue allergen immunotherapy and have success to an epinephrine autoinjector set.  Get allergy injection today   Allergic conjunctivitis Continue Pataday 1 drop in each eye once a day as needed for red itchy eyes  Alpha gal allergy Continue to avoid mammalian meat, dairy, and products containing gelatin.  In case of an allergic reaction, take Benadryl 50 mg every 4 hours, and if life-threatening symptoms occur, inject with AuviQ 0.3 mg.   I'm so sorry to hear about the health issues you are dealing with.  Let know if we can support you in any way!   Follow up:  2  months   Thank you so much for letting me partake in your care today.  Don't hesitate to reach out if you have any additional concerns!  , MD  [facility] [place] [place]

## 2022-11-04 NOTE — Progress Notes (Signed)
Follow Up Note  RE: Amy Pope MRN: [id number] DOB: August 19, 1976 Date of Office Visit: 11/04/2022  Referring provider: Bonnita Nasuti, MD Primary care provider: Bonnita Nasuti, MD  Chief Complaint: No chief complaint on file.  History of Present Illness: I had the pleasure of seeing Amy Pope for a follow up visit at the [facility] [place] [facility] [place] on 11/09/2022. She is a 46 y.o. female, who is being followed for persistent asthma, alpha gal, allergic conjunctivitis. Her previous allergy office visit was on 09/11/2022 with Dr. Edison Pace. Today is a regular follow up visit.  History obtained from patient, chart review.  At last visit she had acute exacerbation and was treated with prednisone taper.  Her Symbicort was increased to 4 puffs twice daily for 2 weeks.  She was also diagnosed with basal cell carcinoma which she has had subsequently removed.  She also has chronic hip and back pain and has recently been referred to spine center for further evaluation.  Asthma - Medical therapy: Symbicort 160 mcg 2 puffs twice daily albuterol Hfa and nebulizer - Rescue inhaler use: Initially had improvement in cough, wheeze, dyspnea with prednisone however had recurrent symptoms in early upper and received a third oral corticosteroid course in her primary care.  Since then symptoms have improved - Symptoms: daily cough, wheeze, dyspnea  - Exacerbation history: 2 ABX for respiratory illness since last visit, 3 OCS, 0 ED, 1 UC visits in the past year  - ACT: 18 /25 - Adverse effects of medication: denies  - Previous FEV1: 1.88 L, 58% with no postbronchodilator response - Biologic Labs not done   Allergic Rhinitis - Medical therapy: astelin, zyrtec  - Symptoms: she reports mild rhinorrhea and nasal congestion - Adverse effects of medications: denies  - Allergy testing history: positive to  mold, cockroach, cat, dog, dust mite, and weed pollen - Immunotherapy: Vial 1 (m-cr), vial 2  (w-dm-c-d); last injection 10/01/2022 of maintenance dose - Large Local Reactions: denies  - Systemic Reactions: denies  - Beta Blockers: denies  - History of Reflux denies  - History of Sinus Surgery denies     Food Allergy: continues to avoid red meat and milk, can tolerate small amounts of cheese, yogurt -denies  accidental exposures - denies use of epinephrine -Previous testing: 04/09/2022: Alpha-gal 0.23, beef 0.12, clam 0.12 -She is a vegetarian is not interested in introducing red meat at any point  Assessment and Plan: Amy Pope is a 46 y.o. female with: Moderate persistent asthma without complication  Other allergic rhinitis  Other chronic allergic conjunctivitis of both eyes  Allergy to alpha-gal Plan: Patient Instructions  Moderate persistent asthma: not well controlled Based on recurrent exacerbation we need to change your asthma care  This replaces your symbicort: Start Breztri 160 mcg 2 puffs twice daily and singulair 10mg  daily  We will get spirometry at next visit    PLAN:  - Spacer use reviewed. - Daily controller medication(s):  Breztri 160 mcg 2 puffs twice daily and Singulair 10mg  daily - Prior to physical activity: albuterol 2 puffs 10-15 minutes before physical activity. - Rescue medications: albuterol 4 puffs every 4-6 hours as needed - Changes during respiratory infections or worsening symptoms: Add on Symbicort to 2 puffs twice daily for TWO WEEKS. - Asthma control goals:  * Full participation in all desired activities (may need albuterol before activity) * Albuterol use two time or less a week on average (not counting use with activity) * Cough interfering  with sleep two time or less a month * Oral steroids no more than once a year * No hospitalizations    Allergic rhinitis Continue allergen avoidance measures directed toward mold, cockroach, cat, dog, dust mite, and weed pollen Continue OTC antihistamines as needed  Continue azelastine 2 sprays in  each nostril twice a day as needed for runny nose Consider saline nasal rinses as needed for nasal symptoms. Use this before any medicated nasal sprays for best result Continue allergen immunotherapy and have success to an epinephrine autoinjector set.  Get allergy injection today   Allergic conjunctivitis Continue Pataday 1 drop in each eye once a day as needed for red itchy eyes  Alpha gal allergy Continue to avoid mammalian meat, dairy, and products containing gelatin.  In case of an allergic reaction, take Benadryl 50 mg every 4 hours, and if life-threatening symptoms occur, inject with AuviQ 0.3 mg.   I'm so sorry to hear about the health issues you are dealing with.  Let us know if we can support you in any way!   Follow up:  2 months   Thank you so much for letting me partake in your care today.  Don't hesitate to reach out if you have any additional concerns!  Amy Marion, MD  [facility] [place] [place] No follow-ups on file.  Meds ordered this encounter  Medications   DISCONTD: Budeson-Glycopyrrol-Formoterol (BREZTRI AEROSPHERE) 160-9-4.8 MCG/ACT AERO    Sig: Inhale 2 puffs into the lungs in the morning and at bedtime.    Dispense:  10.7 g    Refill:  5   Budeson-Glycopyrrol-Formoterol (BREZTRI AEROSPHERE) 160-9-4.8 MCG/ACT AERO    Sig: Inhale 2 puffs into the lungs in the morning and at bedtime.    Dispense:  10.7 g    Refill:  5   montelukast (SINGULAIR) 10 MG tablet    Sig: Take 1 tablet (10 mg total) by mouth at bedtime.    Dispense:  30 tablet    Refill:  5    Lab Orders  No laboratory test(s) ordered today   Diagnostics: None done    Medication List:  Current Outpatient Medications  Medication Sig Dispense Refill   acetaminophen (TYLENOL) 500 MG tablet Take by mouth.     albuterol (VENTOLIN HFA) 108 (90 Base) MCG/ACT inhaler Inhale 1-2 puffs into the lungs every 4 (four) hours as needed for wheezing or shortness of breath (every 4-6  hours). 18 g 1   ALPRAZolam (XANAX) 1 MG tablet Take 1 tablet by mouth 2 (two) times daily.     azelastine (ASTELIN) 0.1 % nasal spray Place 2 sprays into both nostrils 2 (two) times daily. Use in each nostril as directed 30 mL 12   ciclopirox (PENLAC) 8 % solution Apply topically at bedtime. Apply over nail and surrounding skin. Apply daily over previous coat. After seven (7) days, may remove with alcohol and continue cycle. 6.6 mL 0   cyclobenzaprine (FLEXERIL) 10 MG tablet 1 tablet as needed     esomeprazole (NEXIUM) 20 MG capsule Take by mouth.     ibuprofen (ADVIL,MOTRIN) 800 MG tablet Take 800 mg by mouth every 8 (eight) hours as needed (duexis).     meloxicam (MOBIC) 15 MG tablet 1 tablet     montelukast (SINGULAIR) 10 MG tablet Take 1 tablet (10 mg total) by mouth at bedtime. 30 tablet 5   Multiple Vitamin (MULTIVITAMIN) tablet Take 1 tablet by mouth daily.     Omega-3 Fatty  Acids (FISH OIL) 1000 MG CAPS 1 capsule     Semaglutide,0.25 or 0.5MG /DOS, (OZEMPIC, 0.25 OR 0.5 MG/DOSE,) 2 MG/3ML SOPN Inject 0.25 mg into the skin once a week.     Budeson-Glycopyrrol-Formoterol (BREZTRI AEROSPHERE) 160-9-4.8 MCG/ACT AERO Inhale 2 puffs into the lungs in the morning and at bedtime. 10.7 g 5   EPINEPHrine 0.3 mg/0.3 mL IJ SOAJ injection Inject 0.3 mg into the muscle See admin instructions. 2 each 1   levocetirizine (XYZAL) 5 MG tablet Take 1 tablet (5 mg total) by mouth daily. (Patient not taking: Reported on 05/12/2022) 30 tablet 0   No current facility-administered medications for this visit.   Allergies: Allergies  Allergen Reactions   Morphine Swelling   Morphine And Related    Morphine Sulfate Other (See Comments)   I reviewed her past medical history, social history, family history, and environmental history and no significant changes have been reported from her previous visit.  ROS: All others negative except as noted per HPI.   Objective: BP 120/80   Pulse 79   Temp 97.8 F (36.6  C) (Temporal)   Resp 18   SpO2 98%  There is no height or weight on file to calculate BMI. General Appearance:  Alert, cooperative, no distress, appears stated age  Head:  Normocephalic, without obvious abnormality, atraumatic  Eyes:  Conjunctiva clear, EOM's intact  Nose: Nares normal, hypertrophic turbinates, no visible anterior polyps, and septum midline  Throat: Lips, tongue normal; teeth and gums normal, normal posterior oropharynx and no tonsillar exudate  Neck: Supple, symmetrical  Lungs:   clear to auscultation bilaterally, Respirations unlabored, no coughing  Heart:  regular rate and rhythm and no murmur, Appears well perfused  Extremities: No edema  Skin: Skin color, texture, turgor normal, no rashes or lesions on visualized portions of skin   Neurologic: No gross deficits   Previous notes and tests were reviewed. The plan was reviewed with the patient/family, and all questions/concerned were addressed.  It was my pleasure to see Amy Pope today and participate in her care. Please feel free to contact me with any questions or concerns.  Sincerely,  Amy Luz, MD  Allergy & Immunology  [facility] Office: [phone]

## 2022-11-09 ENCOUNTER — Encounter: Payer: Self-pay | Admitting: Internal Medicine

## 2022-11-10 NOTE — Telephone Encounter (Signed)
That seems an unusual response to singulair, but I believe the timeline. Singulair can have some behavioral changes, any chance this could have been anxiety?  These symptoms usually start immediately with this medication and stop immediately when you stop it, which sounds like the onset/offset you are experiencing.     Either way, we blame the singulair and you should not be on this medication ever again.  Continue the breztri

## 2022-11-25 ENCOUNTER — Ambulatory Visit (INDEPENDENT_AMBULATORY_CARE_PROVIDER_SITE_OTHER): Payer: BC Managed Care – PPO | Admitting: *Deleted

## 2022-11-25 ENCOUNTER — Other Ambulatory Visit: Payer: Self-pay

## 2022-11-25 DIAGNOSIS — J309 Allergic rhinitis, unspecified: Secondary | ICD-10-CM

## 2022-11-25 MED ORDER — ALBUTEROL SULFATE HFA 108 (90 BASE) MCG/ACT IN AERS: 1.0000 | INHALATION_SPRAY | RESPIRATORY_TRACT | 5 refills | Status: DC | PRN

## 2022-12-25 ENCOUNTER — Ambulatory Visit (INDEPENDENT_AMBULATORY_CARE_PROVIDER_SITE_OTHER): Payer: BC Managed Care – PPO

## 2022-12-25 DIAGNOSIS — J309 Allergic rhinitis, unspecified: Secondary | ICD-10-CM

## 2022-12-31 ENCOUNTER — Ambulatory Visit (INDEPENDENT_AMBULATORY_CARE_PROVIDER_SITE_OTHER): Payer: BC Managed Care – PPO

## 2022-12-31 ENCOUNTER — Other Ambulatory Visit: Payer: Self-pay | Admitting: Plastic Surgery

## 2022-12-31 DIAGNOSIS — Z1231 Encounter for screening mammogram for malignant neoplasm of breast: Secondary | ICD-10-CM

## 2022-12-31 DIAGNOSIS — J309 Allergic rhinitis, unspecified: Secondary | ICD-10-CM | POA: Diagnosis not present

## 2023-01-11 ENCOUNTER — Ambulatory Visit: Payer: BC Managed Care – PPO | Admitting: Internal Medicine

## 2023-01-15 ENCOUNTER — Ambulatory Visit (INDEPENDENT_AMBULATORY_CARE_PROVIDER_SITE_OTHER): Payer: BC Managed Care – PPO

## 2023-01-15 DIAGNOSIS — J309 Allergic rhinitis, unspecified: Secondary | ICD-10-CM | POA: Diagnosis not present

## 2023-01-29 ENCOUNTER — Ambulatory Visit (INDEPENDENT_AMBULATORY_CARE_PROVIDER_SITE_OTHER): Payer: BC Managed Care – PPO

## 2023-01-29 DIAGNOSIS — J309 Allergic rhinitis, unspecified: Secondary | ICD-10-CM

## 2023-02-05 ENCOUNTER — Ambulatory Visit: Payer: Self-pay

## 2023-02-05 ENCOUNTER — Ambulatory Visit: Payer: BC Managed Care – PPO | Admitting: Internal Medicine

## 2023-02-05 ENCOUNTER — Other Ambulatory Visit: Payer: Self-pay

## 2023-02-05 ENCOUNTER — Ambulatory Visit
Admission: RE | Admit: 2023-02-05 | Discharge: 2023-02-05 | Disposition: A | Discharge status: Home / Self Care | Payer: BC Managed Care – PPO | Source: Ambulatory Visit | Attending: Plastic Surgery | Admitting: Plastic Surgery

## 2023-02-05 VITALS — BP 120/70 | HR 66 | Temp 97.5°F | Resp 16 | Wt 232.9 lb

## 2023-02-05 DIAGNOSIS — H1045 Other chronic allergic conjunctivitis: Secondary | ICD-10-CM

## 2023-02-05 DIAGNOSIS — J309 Allergic rhinitis, unspecified: Secondary | ICD-10-CM

## 2023-02-05 DIAGNOSIS — Z1231 Encounter for screening mammogram for malignant neoplasm of breast: Secondary | ICD-10-CM

## 2023-02-05 DIAGNOSIS — J4540 Moderate persistent asthma, uncomplicated: Secondary | ICD-10-CM

## 2023-02-05 DIAGNOSIS — Z91018 Allergy to other foods: Secondary | ICD-10-CM | POA: Diagnosis not present

## 2023-02-05 DIAGNOSIS — J3089 Other allergic rhinitis: Secondary | ICD-10-CM

## 2023-02-05 NOTE — Patient Instructions (Addendum)
Moderate persistent asthma:  Breathing tests today showed:  We will get labs today to evaluate for biologics   PLAN:  - Spacer use reviewed. - Daily controller medication(s):  Breztri 160 mcg 2 puffs twice daily - Prior to physical activity: albuterol 2 puffs 10-15 minutes before physical activity. - Rescue medications: albuterol 4 puffs every 4-6 hours as needed - Changes during respiratory infections or worsening symptoms: Add on Symbicort to 2 puffs twice daily for TWO WEEKS. - Asthma control goals:  * Full participation in all desired activities (may need albuterol before activity) * Albuterol use two time or less a week on average (not counting use with activity) * Cough interfering with sleep two time or less a month * Oral steroids no more than once a year * No hospitalizations    Allergic rhinitis Continue allergen avoidance measures directed toward mold, cockroach, cat, dog, dust mite, and weed pollen Continue OTC antihistamines as needed  Continue azelastine 2 sprays in each nostril twice a day as needed for runny nose Consider saline nasal rinses as needed for nasal symptoms. Use this before any medicated nasal sprays for best result Continue allergen immunotherapy and have success to an epinephrine autoinjector set.  Get allergy injection today   Allergic conjunctivitis Continue Pataday 1 drop in each eye once a day as needed for red itchy eyes  Alpha gal allergy Continue to avoid mammalian meat, dairy, and products containing gelatin.  In case of an allergic reaction, take Benadryl 50 mg every 4 hours, and if life-threatening symptoms occur, inject with AuviQ 0.3 mg.   Follow up:  we will call you with lab results and plan for biologics Follow up: in clinic in 4 months   Thank you so much for letting me partake in your care today.  Don't hesitate to reach out if you have any additional concerns!  [name], MD  [facility] [place], [place]

## 2023-02-05 NOTE — Addendum Note (Signed)
Addended by: Alvin Critchley, Axl Rodino on: 02/05/2023 04:53 PM   Modules accepted: Orders

## 2023-02-05 NOTE — Progress Notes (Addendum)
Follow Up Note  RE: Amy Pope MRN: [id number] DOB: 1976-11-20 Date of Office Visit: 02/05/2023  Referring provider: Bonnita Nasuti, MD Primary care provider: Bonnita Nasuti, MD  Chief Complaint: Follow-up  History of Present Illness: I had the pleasure of seeing Amy Pope for a follow up visit at the [facility] on 02/05/2023. She is a 47 y.o. female, who is being followed for persistent asthma, alpha gal, allergic conjunctivitis. Her previous allergy office visit was on 2/6/23with Dr. Edison Pace. Today is a regular follow up visit.  History obtained from patient, chart review.  At last visit asthma was not well controlled and she was stepped up to [facility] 159mg and singulair added.  She contacted clinic on 11/09/2022 reporting increased short of breath in the mornings.  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 - Previous FEV1: 1.88 L, 58% with no postbronchodilator response - Biologic Labs not done   Allergic Rhinitis - Medical therapy: astelin, zyrtec  - Symptoms: she reports rare rhinorrhea and nasal congestion  - Adverse effects of medications: denies  - Allergy testing history: positive to  mold, cockroach, cat, dog, dust mite, and weed pollen - Immunotherapy: Vial 1 (m-cr), vial 2 (w-dm-c-d); last injection 01/29/23 of maintenance dose - Large Local Reactions: denies  - Systemic Reactions: denies  - Beta Blockers: denies  - History of Reflux denies  - History of Sinus Surgery denies     Food Allergy: continues to  avoid red meat and milk, can tolerate small amounts of cheese, yogurt -denies  accidental exposures - denies use of epinephrine -Previous testing: 04/09/2022: Alpha-gal 0.23, beef 0.12, clam 0.12 -She is a vegetarian is not interested in introducing red meat at any point  Assessment and Plan: Amy Pope a 47y.o. female with: Not well controlled moderate persistent asthma - Plan: CBC with Differential, Allergens w/Total IgE Area 2  Other allergic rhinitis  Other chronic allergic conjunctivitis of both eyes  Allergy to alpha-gal  Moderate persistent asthma without complication Plan: Patient Instructions  Moderate persistent asthma:  Breathing tests today showed:  We will get labs today to evaluate for biologics   PLAN:  - Spacer use reviewed. - Daily controller medication(s):  Breztri 160 mcg 2 puffs twice daily - Prior to physical activity: albuterol 2 puffs 10-15 minutes before physical activity. - Rescue medications: albuterol 4 puffs every 4-6 hours as needed - Changes during respiratory infections or worsening symptoms: Add on Symbicort to 2 puffs twice daily for TWO WEEKS. - Asthma control goals:  * Full participation in all desired activities (may need albuterol before activity) * Albuterol use two time or less a week on average (not counting use with activity) * Cough interfering with sleep two time or less a month * Oral steroids no more than once a year * No hospitalizations    Allergic rhinitis Continue allergen avoidance measures directed toward mold, cockroach, cat, dog, dust mite, and weed pollen Continue OTC antihistamines as needed  Continue azelastine  2 sprays in each nostril twice a day as needed for runny nose Consider saline nasal rinses as needed for nasal symptoms. Use this before any medicated nasal sprays for best result Continue allergen immunotherapy and have success to an epinephrine autoinjector set.  Get allergy injection today   Allergic  conjunctivitis Continue Pataday 1 drop in each eye once a day as needed for red itchy eyes  Alpha gal allergy Continue to avoid mammalian meat, dairy, and products containing gelatin.  In case of an allergic reaction, take Benadryl 50 mg every 4 hours, and if life-threatening symptoms occur, inject with AuviQ 0.3 mg.   Follow up:  we will call you with lab results and plan for biologics Follow up: in clinic in 4 months   Thank you so much for letting me partake in your care today.  Don't hesitate to reach out if you have any additional concerns!  [name], MD  [facility] [place] [place] No follow-ups on file.  No orders of the defined types were placed in this encounter.   Lab Orders         CBC with Differential         Allergens w/Total IgE Area 2     Diagnostics: Spirometry:  Tracings reviewed. Her effort: Good reproducible efforts. FVC:  2.62L FEV1:  2.19L, 81% predicted FEV1/FVC ratio: 83% Interpretation: Spirometry consistent with normal pattern  Please see scanned spirometry results for details.   Medication List:  Current Outpatient Medications  Medication Sig Dispense Refill   acetaminophen (TYLENOL) 500 MG tablet Take by mouth.     albuterol (VENTOLIN HFA) 108 (90 Base) MCG/ACT inhaler Inhale 1-2 puffs into the lungs every 4 (four) hours as needed for wheezing or shortness of breath (every 4-6 hours). 18 g 5   ALPRAZolam (XANAX) 1 MG tablet Take 1 tablet by mouth 2 (two) times daily.     azelastine (ASTELIN) 0.1 % nasal spray Place 2 sprays into both nostrils 2 (two) times daily. Use in each nostril as directed 30 mL 12   Budeson-Glycopyrrol-Formoterol (BREZTRI AEROSPHERE) 160-9-4.8 MCG/ACT AERO Inhale 2 puffs into the lungs in the morning and at bedtime. 10.7 g 5   ciclopirox (PENLAC) 8 % solution Apply topically at bedtime. Apply over nail and surrounding skin. Apply daily over previous coat. After seven (7) days, may remove with alcohol and  continue cycle. 6.6 mL 0   cyclobenzaprine (FLEXERIL) 10 MG tablet 1 tablet as needed     EPINEPHrine 0.3 mg/0.3 mL IJ SOAJ injection Inject 0.3 mg into the muscle See admin instructions. 2 each 1   esomeprazole (NEXIUM) 20 MG capsule Take by mouth.     ibuprofen (ADVIL,MOTRIN) 800 MG tablet Take 800 mg by mouth every 8 (eight) hours as needed (duexis).     meloxicam (MOBIC) 15 MG tablet 1 tablet     montelukast (SINGULAIR) 10 MG tablet Take 1 tablet (10 mg total) by mouth at bedtime. 30 tablet 5   Multiple Vitamin (MULTIVITAMIN) tablet Take 1 tablet by mouth daily.     Omega-3 Fatty Acids (FISH OIL) 1000 MG CAPS 1 capsule     levocetirizine (XYZAL) 5 MG tablet Take 1 tablet (5 mg total) by mouth daily. (Patient not taking: Reported on 05/12/2022) 30 tablet 0   No current facility-administered medications for this visit.   Allergies: Allergies  Allergen Reactions   Morphine Swelling   Morphine And Related    Morphine Sulfate Other (See Comments)  I reviewed her past medical history, social history, family history, and environmental history and no significant changes have been reported from her previous visit.  ROS: All others negative except as noted per HPI.   Objective: BP 120/70   Pulse 66   Temp (!) 97.5 F (36.4 C) (Temporal)   Resp 16   Wt 232 lb 14.4 oz (105.6 kg)   SpO2 97%   BMI 35.41 kg/m  Body mass index is 35.41 kg/m. General Appearance:  Alert, cooperative, no distress, appears stated age  Head:  Normocephalic, without obvious abnormality, atraumatic  Eyes:  Conjunctiva clear, EOM's intact  Nose: Nares normal, hypertrophic turbinates, no visible anterior polyps, and septum midline  Throat: Lips, tongue normal; teeth and gums normal, normal posterior oropharynx and no tonsillar exudate  Neck: Supple, symmetrical  Lungs:   clear to auscultation bilaterally, Respirations unlabored, no coughing  Heart:  regular rate and rhythm and no murmur, Appears well perfused   Extremities: No edema  Skin: Skin color, texture, turgor normal, no rashes or lesions on visualized portions of skin   Neurologic: No gross deficits   Previous notes and tests were reviewed. The plan was reviewed with the patient/family, and all questions/concerned were addressed.  It was my pleasure to see Amy Pope today and participate in her care. Please feel free to contact me with any questions or concerns.  Sincerely,  [name], MD  Allergy & Immunology  [facility] [place] [facility] Office: [phone]

## 2023-02-09 LAB — CBC WITH DIFFERENTIAL/PLATELET
Basophils Absolute: 0.1 10*3/uL (ref 0.0–0.2)
Basos: 1 %
EOS (ABSOLUTE): 0.2 10*3/uL (ref 0.0–0.4)
Eos: 3 %
Hematocrit: 39.3 % (ref 34.0–46.6)
Hemoglobin: 13.2 g/dL (ref 11.1–15.9)
Immature Grans (Abs): 0 10*3/uL (ref 0.0–0.1)
Immature Granulocytes: 0 %
Lymphocytes Absolute: 1.4 10*3/uL (ref 0.7–3.1)
Lymphs: 24 %
MCH: 31 pg (ref 26.6–33.0)
MCHC: 33.6 g/dL (ref 31.5–35.7)
MCV: 92 fL (ref 79–97)
Monocytes Absolute: 0.3 10*3/uL (ref 0.1–0.9)
Monocytes: 6 %
Neutrophils Absolute: 3.8 10*3/uL (ref 1.4–7.0)
Neutrophils: 66 %
Platelets: 312 10*3/uL (ref 150–450)
RBC: 4.26 x10E6/uL (ref 3.77–5.28)
RDW: 12.4 % (ref 11.7–15.4)
WBC: 5.8 10*3/uL (ref 3.4–10.8)

## 2023-02-09 LAB — ALLERGENS W/TOTAL IGE AREA 2

## 2023-02-12 ENCOUNTER — Ambulatory Visit (INDEPENDENT_AMBULATORY_CARE_PROVIDER_SITE_OTHER): Payer: BC Managed Care – PPO

## 2023-02-12 DIAGNOSIS — J309 Allergic rhinitis, unspecified: Secondary | ICD-10-CM | POA: Diagnosis not present

## 2023-02-17 NOTE — Progress Notes (Signed)
Patient is a candidate for biologics for asthma.  I am leaning towards tezspire or dupixent.  Can we mail her out patient information on both?  If she has more question, we can set up a follow up appointment to discuss.  Can someone let patient know?  Thanks!

## 2023-03-03 ENCOUNTER — Telehealth: Payer: Self-pay | Admitting: Internal Medicine

## 2023-03-03 NOTE — Telephone Encounter (Signed)
Patient would like a call back to discuss an injection that was talked about at her ov.

## 2023-03-04 NOTE — Telephone Encounter (Signed)
Pt wants to know which one would be best beneficial to her the tezspire or dupixent? She is okay with moving forward once you pick one!

## 2023-03-05 ENCOUNTER — Encounter: Payer: Self-pay | Admitting: Internal Medicine

## 2023-03-08 MED ORDER — BREZTRI AEROSPHERE 160-9-4.8 MCG/ACT IN AERO: 2.0000 | INHALATION_SPRAY | Freq: Two times a day (BID) | RESPIRATORY_TRACT | 5 refills | Status: DC

## 2023-03-08 NOTE — Telephone Encounter (Signed)
Breztri refilled. Overall Amy Pope is very safe, but I can understand needing more time to come to the decision

## 2023-03-16 NOTE — Telephone Encounter (Signed)
Called patient and she advised that she wants to hold off on starting tezspire for now. I advised her if she decides she wants to start therapy she can reach back out to [place]

## 2023-03-18 ENCOUNTER — Ambulatory Visit (INDEPENDENT_AMBULATORY_CARE_PROVIDER_SITE_OTHER): Payer: BC Managed Care – PPO

## 2023-03-18 DIAGNOSIS — J309 Allergic rhinitis, unspecified: Secondary | ICD-10-CM | POA: Diagnosis not present

## 2023-03-31 NOTE — Progress Notes (Signed)
EXP 03/30/24 

## 2023-04-01 DIAGNOSIS — J3089 Other allergic rhinitis: Secondary | ICD-10-CM | POA: Diagnosis not present

## 2023-04-22 ENCOUNTER — Ambulatory Visit (INDEPENDENT_AMBULATORY_CARE_PROVIDER_SITE_OTHER): Payer: BC Managed Care – PPO

## 2023-04-22 DIAGNOSIS — J309 Allergic rhinitis, unspecified: Secondary | ICD-10-CM

## 2023-05-06 IMAGING — MG MM DIGITAL DIAGNOSTIC UNILAT*L* W/ TOMO W/ CAD
4 series · 4 of 12 positions shown · non-contrast
Comparison: Previous exam(s).

CLINICAL DATA: Possible asymmetry in the posterior outer left
breast in the oblique projection of a recent screening mammogram.
Previous bilateral breast reduction.

EXAM:
DIGITAL DIAGNOSTIC UNILATERAL LEFT MAMMOGRAM WITH TOMOSYNTHESIS AND
CAD
TECHNIQUE: Left digital diagnostic mammography and breast tomosynthesis was
performed. The images were evaluated with computer-aided detection.

[L CC synth-2D]
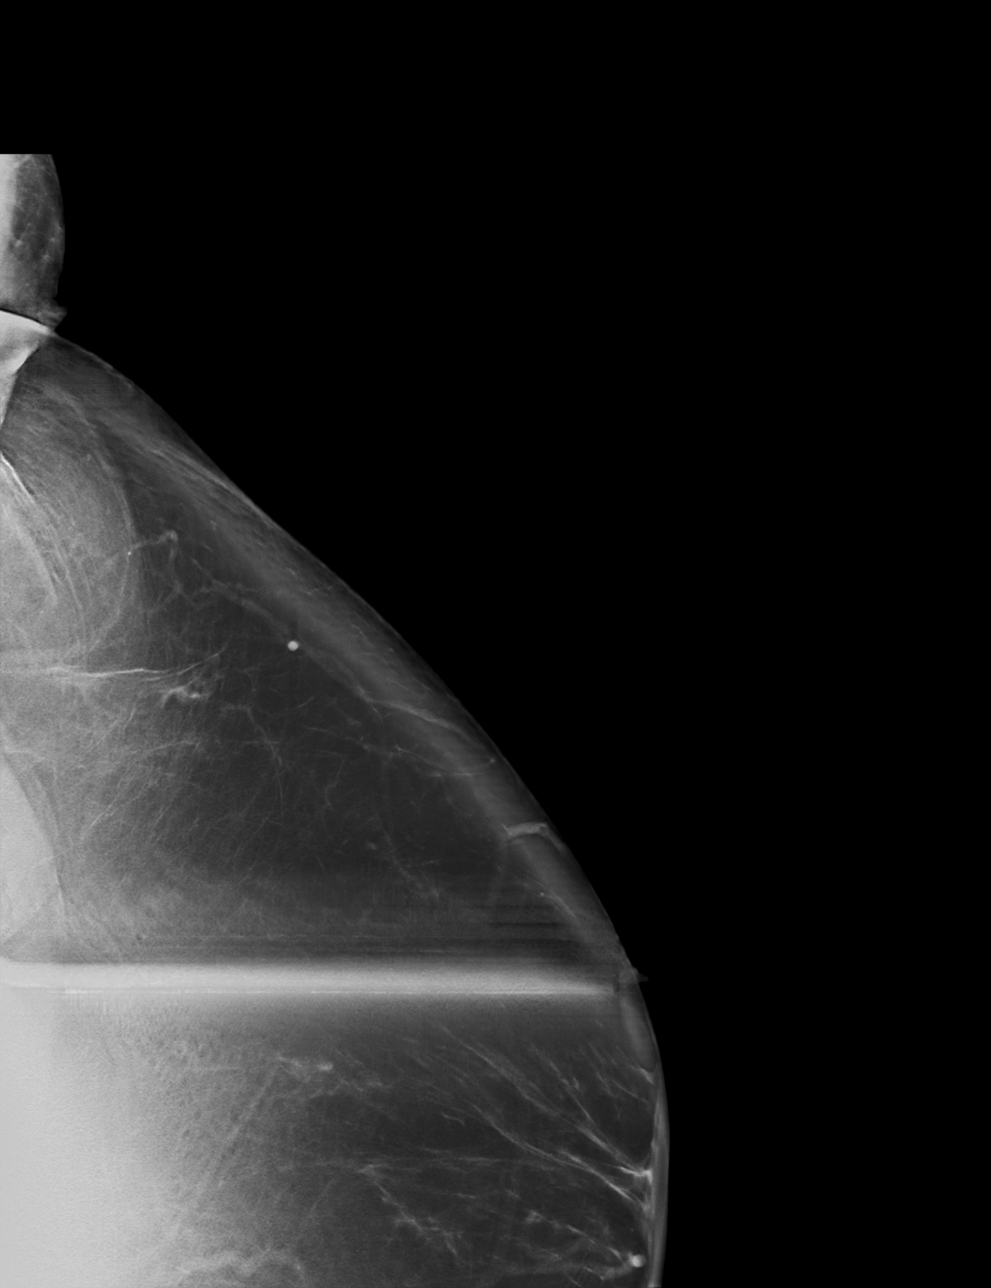

[L MLO synth-2D]
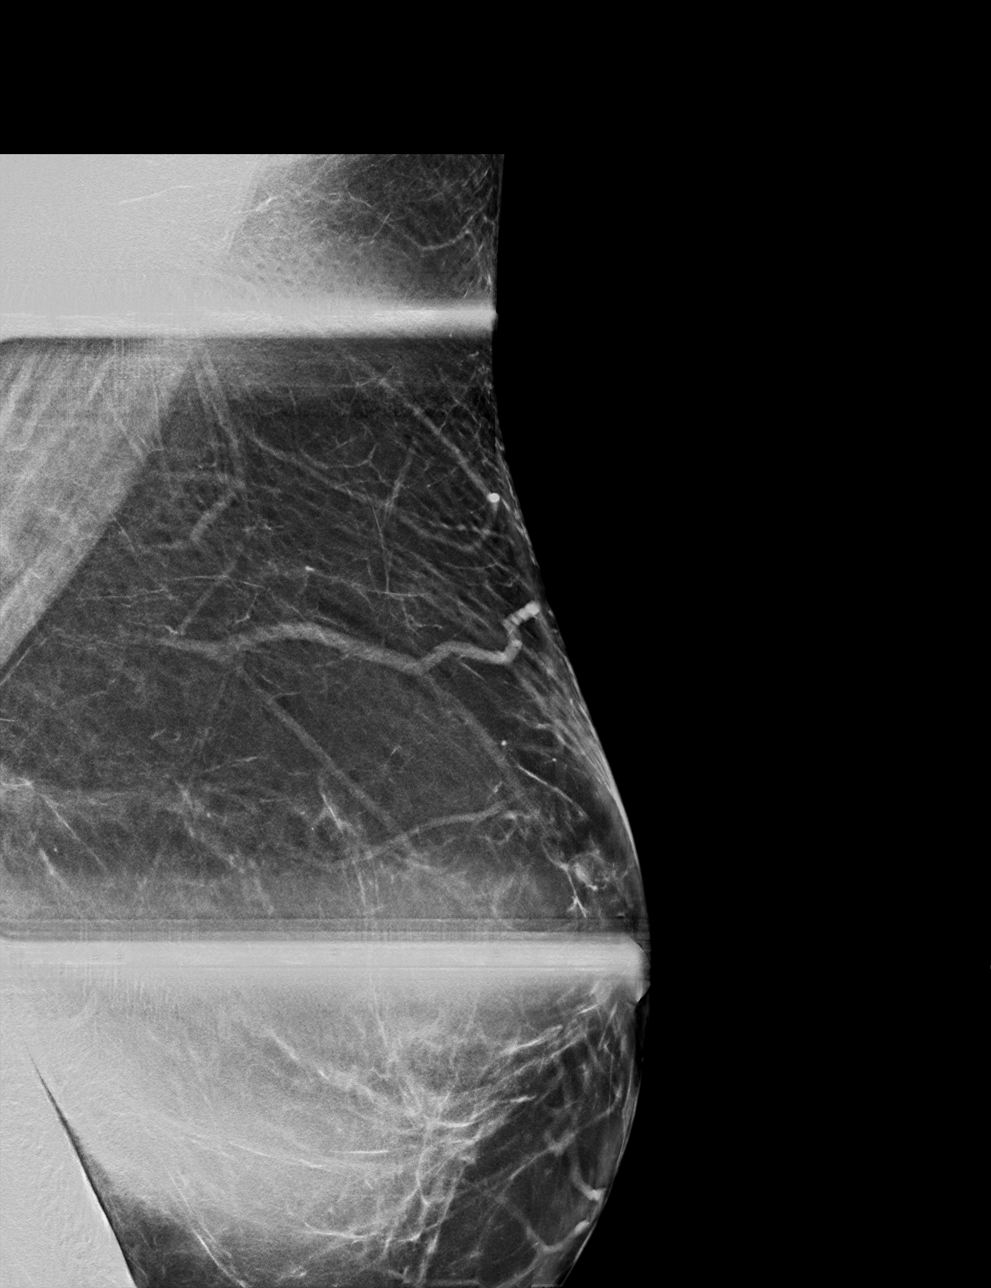

[L CC tomo · tomo slice 51/102.0]
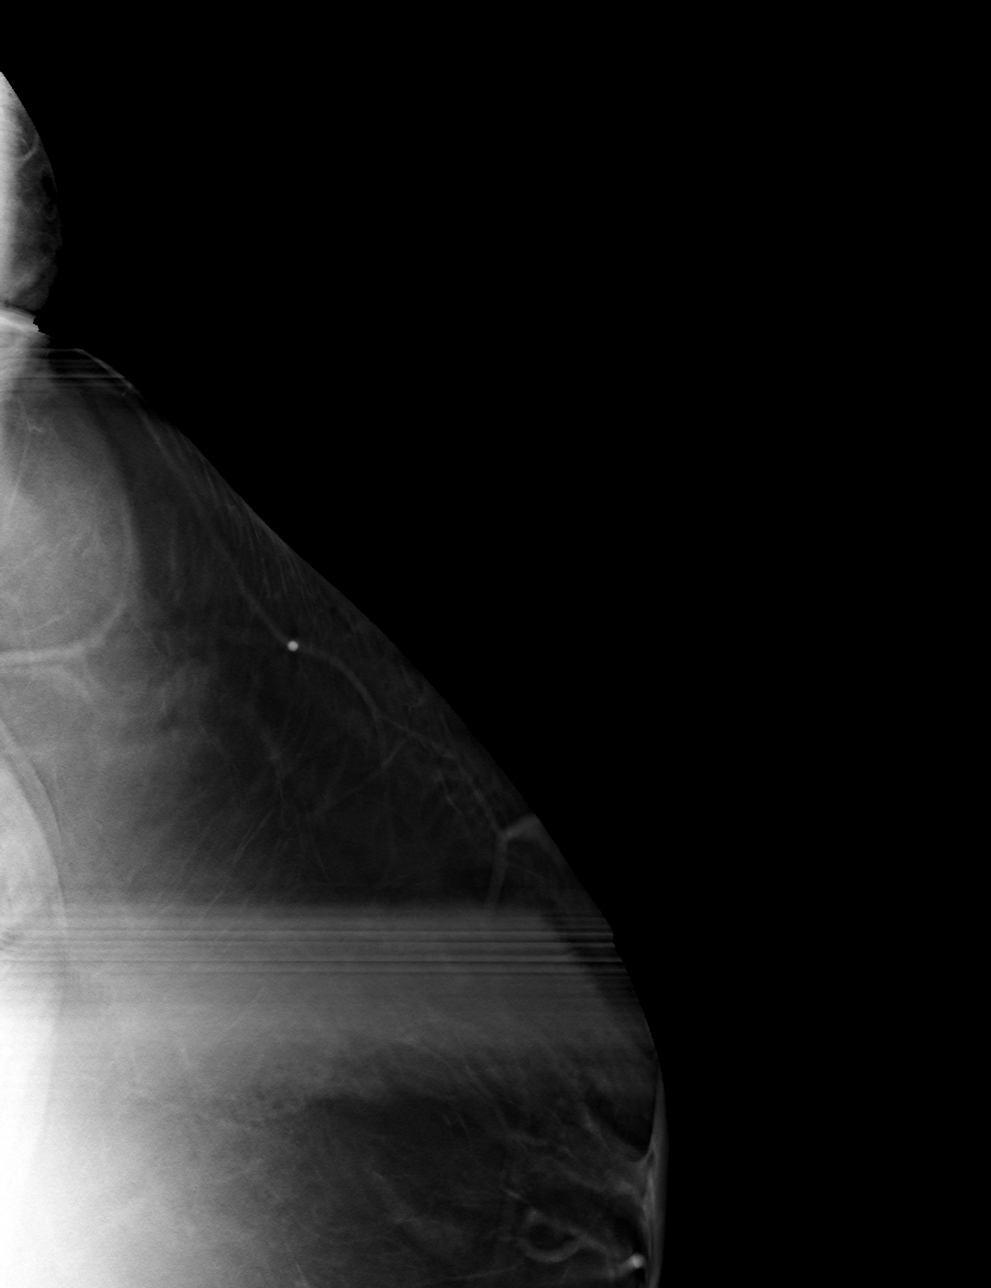

[L MLO tomo · tomo slice 41/82.0]
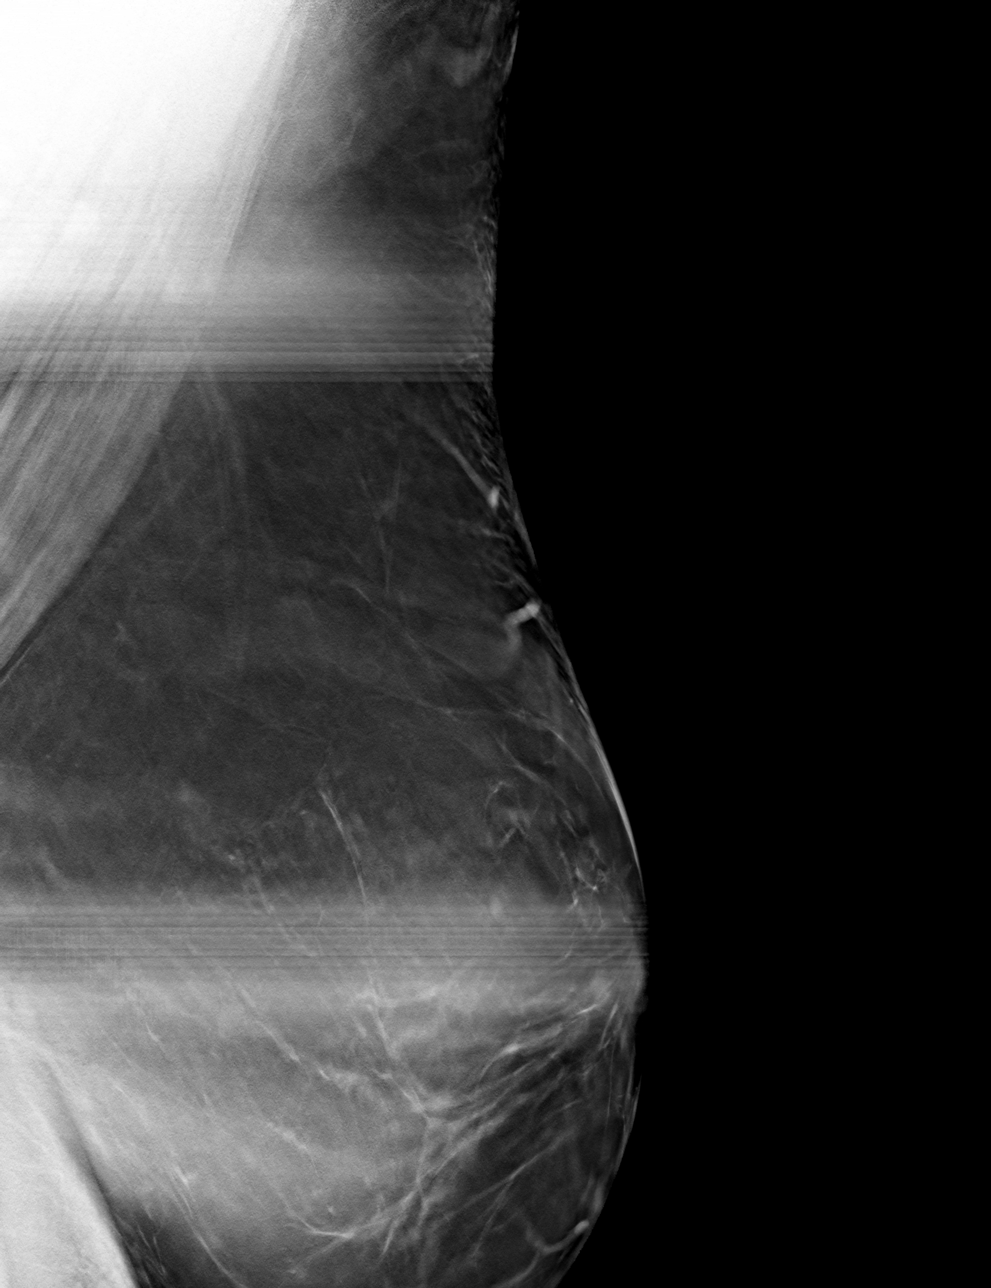

[4 of 12 positions shown; findings below may reference images not displayed]

ACR Breast Density Category b: There are scattered areas of
fibroglandular density.
FINDINGS: 3D tomographic and 2D generated spot compression images of the left
breast demonstrate postreduction scarring in the posterior outer
aspect of the breast at the location of the recently suspected
asymmetry, unchanged since 01/03/2021. No interval findings
suspicious for malignancy.
IMPRESSION: No evidence of malignancy. The recently suspected left breast
asymmetry is mammographically stable post reduction scarring.

RECOMMENDATION:
Bilateral screening mammogram December 2022 when due.

I have discussed the findings and recommendations with the patient.
If applicable, a reminder letter will be sent to the patient
regarding the next appointment.

BI-RADS CATEGORY  2: Benign.

## 2023-05-14 ENCOUNTER — Other Ambulatory Visit: Payer: Self-pay | Admitting: Family

## 2023-05-21 ENCOUNTER — Ambulatory Visit (INDEPENDENT_AMBULATORY_CARE_PROVIDER_SITE_OTHER): Payer: BC Managed Care – PPO

## 2023-05-21 DIAGNOSIS — J309 Allergic rhinitis, unspecified: Secondary | ICD-10-CM

## 2023-06-18 ENCOUNTER — Other Ambulatory Visit (HOSPITAL_BASED_OUTPATIENT_CLINIC_OR_DEPARTMENT_OTHER): Payer: Self-pay

## 2023-06-18 MED ORDER — MOUNJARO 2.5 MG/0.5ML ~~LOC~~ SOAJ
2.5000 mg | SUBCUTANEOUS | 0 refills | Status: DC
  Filled 2023-06-18: qty 2, 28d supply, fill #0

## 2023-06-18 MED ORDER — FREESTYLE LIBRE 3 SENSOR MISC
0 refills | Status: DC
  Filled 2023-06-18: qty 2, 28d supply, fill #0

## 2023-06-25 ENCOUNTER — Ambulatory Visit (INDEPENDENT_AMBULATORY_CARE_PROVIDER_SITE_OTHER): Payer: BC Managed Care – PPO

## 2023-06-25 ENCOUNTER — Ambulatory Visit: Payer: BC Managed Care – PPO | Admitting: Internal Medicine

## 2023-06-25 ENCOUNTER — Encounter: Payer: Self-pay | Admitting: Internal Medicine

## 2023-06-25 VITALS — BP 118/82 | HR 78 | Temp 97.9°F | Resp 16

## 2023-06-25 DIAGNOSIS — Z91018 Allergy to other foods: Secondary | ICD-10-CM

## 2023-06-25 DIAGNOSIS — J4540 Moderate persistent asthma, uncomplicated: Secondary | ICD-10-CM

## 2023-06-25 DIAGNOSIS — J3089 Other allergic rhinitis: Secondary | ICD-10-CM | POA: Diagnosis not present

## 2023-06-25 DIAGNOSIS — J309 Allergic rhinitis, unspecified: Secondary | ICD-10-CM | POA: Diagnosis not present

## 2023-06-25 DIAGNOSIS — H1045 Other chronic allergic conjunctivitis: Secondary | ICD-10-CM

## 2023-06-25 DIAGNOSIS — J4550 Severe persistent asthma, uncomplicated: Secondary | ICD-10-CM | POA: Diagnosis not present

## 2023-06-25 MED ORDER — TEZEPELUMAB-EKKO 210 MG/1.91ML ~~LOC~~ SOSY
210.0000 mg | PREFILLED_SYRINGE | Freq: Once | SUBCUTANEOUS | Status: AC
  Administered 2023-06-25: 210 mg via SUBCUTANEOUS

## 2023-06-25 NOTE — Patient Instructions (Addendum)
Moderate persistent asthma: not well controlled  Breathing tests today showed: inflammation in your lungs  Plan to start Tezspire. Initial dose given in clinic today,  Our Biologic coordinator will reach out to you in regards to approval process    PLAN:  - Spacer use reviewed. - Daily controller medication(s):  Breztri 160 mcg 2 puffs twice daily - Prior to physical activity: albuterol 2 puffs 10-15 minutes before physical activity. - Rescue medications: albuterol 4 puffs every 4-6 hours as needed - Changes during respiratory infections or worsening symptoms: Add on Symbicort to 2 puffs twice daily for TWO WEEKS. - Asthma control goals:  * Full participation in all desired activities (may need albuterol before activity) * Albuterol use two time or less a week on average (not counting use with activity) * Cough interfering with sleep two time or less a month * Oral steroids no more than once a year * No hospitalizations    Allergic rhinitis Continue allergen avoidance measures directed toward mold, cockroach, cat, dog, dust mite, and weed pollen Continue OTC antihistamines as needed  Continue azelastine 2 sprays in each nostril twice a day as needed for runny nose Consider saline nasal rinses as needed for nasal symptoms. Use this before any medicated nasal sprays for best result Continue allergen immunotherapy and have success to an epinephrine autoinjector set.    Allergic conjunctivitis Continue Pataday 1 drop in each eye once a day as needed for red itchy eyes  Alpha gal allergy Continue to avoid mammalian meat, dairy, and products containing gelatin.  In case of an allergic reaction, take Benadryl 50 mg every 4 hours, and if life-threatening symptoms occur, inject with AuviQ 0.3 mg.   Follow up:  6 months   Thank you so much for letting me partake in your care today.  Don't hesitate to reach out if you have any additional concerns!  Ferol Luz, MD  [facility] [place], [place]

## 2023-06-25 NOTE — Progress Notes (Unsigned)
Follow Up Note  RE: Amy Pope MRN: [id number] DOB: 09/17/1976 Date of Office Visit: 06/25/2023  Referring provider: Galvin Proffer, MD Primary care provider: Galvin Proffer, MD  Chief Complaint: No chief complaint on file.  History of Present Illness: I had the pleasure of seeing Amy Pope for a follow up visit at the [facility] [place] on 06/25/2023. She is a 47 y.o. female, who is being followed for persistent asthma, alpha gal, allergic conjunctivitis. Her previous allergy office visit was on 02/05/23  with Dr. Marlynn Perking. Today is a regular follow up visit.  History obtained from patient, chart review.  At last visit asthma was not well controlled and biologic treatment with tezpire recommended.  She did not want to start it however.   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 - Previous FEV1:  2.19L, 81% - Biologic Labs not done   Allergic Rhinitis - Medical therapy: astelin, zyrtec  - Symptoms: she reports rare rhinorrhea and nasal congestion  - Adverse effects of medications: denies  - Allergy testing history: positive to  mold, cockroach, cat, dog, dust mite, and weed pollen - Immunotherapy: Vial 1 (m-cr), vial 2 (w-dm-c-d); last injection 01/29/23 of maintenance dose - Large Local Reactions: denies  - Systemic Reactions: denies  - Beta Blockers: denies  - History of Reflux denies  - History of Sinus Surgery denies     Food Allergy: continues to avoid red meat and milk, can tolerate small amounts of cheese, yogurt -denies  accidental exposures - denies use of epinephrine -Previous testing:  04/09/2022: Alpha-gal 0.23, beef 0.12, clam 0.12 -She is a vegetarian is not interested in introducing red meat at any point  Assessment and Plan: [name] is a 47 y.o. female with: No diagnosis found. Plan: There are no Patient Instructions on file for this visit. No follow-ups on file.  No orders of the defined types were placed in this encounter.   Lab Orders  No laboratory test(s) ordered today    Diagnostics: Spirometry:  Tracings reviewed. Her effort: Good reproducible efforts. 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   Medication List:  Current Outpatient Medications  Medication Sig Dispense Refill   acetaminophen (TYLENOL) 500 MG tablet Take by mouth.     albuterol (VENTOLIN HFA) 108 (90 Base) MCG/ACT inhaler Inhale 1-2 puffs into the lungs every 4 (four) hours as needed for wheezing or shortness of breath (every 4-6 hours). 18 g 5   ALPRAZolam (XANAX) 1 MG tablet Take 1 tablet by mouth 2 (two) times daily.     azelastine (ASTELIN) 0.1 % nasal spray Place 2 sprays into both nostrils 2 (two) times daily. Use in each nostril as directed 30 mL 12   Budeson-Glycopyrrol-Formoterol (BREZTRI AEROSPHERE) 160-9-4.8 MCG/ACT AERO Inhale 2 puffs into the lungs in the morning and at bedtime. 10.7 g 5   Continuous Glucose Sensor (FREESTYLE LIBRE 3 SENSOR) MISC Place 1 sensor to the back of your  upper arm. Replace every 14 days 2 each 0   cyclobenzaprine (FLEXERIL) 10 MG tablet 1 tablet as needed     EPINEPHrine 0.3 mg/0.3 mL IJ SOAJ injection Inject 0.3 mg into the muscle See admin instructions. 2 each 1   esomeprazole (NEXIUM) 20 MG capsule Take by mouth.     ibuprofen (ADVIL,MOTRIN) 800 MG tablet Take 800 mg by mouth every 8 (eight) hours as needed (duexis).     levocetirizine (XYZAL) 5 MG tablet Take 1 tablet (5 mg total) by mouth  daily. 30 tablet 0   meloxicam (MOBIC) 15 MG tablet 1 tablet     Multiple Vitamin (MULTIVITAMIN) tablet Take 1 tablet by mouth daily.     Omega-3 Fatty Acids (FISH OIL) 1000 MG CAPS 1 capsule     tirzepatide (MOUNJARO) 2.5 MG/0.5ML Pen Inject 2.5 mg into the skin once a week. 2 mL 0   No current facility-administered medications for this visit.   Allergies: Allergies  Allergen Reactions   Morphine Swelling   Morphine And Codeine    Morphine Sulfate Other (See Comments)   I reviewed her past medical history, social history, family history, and environmental history and no significant changes have been reported from her previous visit.  ROS: All others negative except as noted per HPI.   Objective: There were no vitals taken for this visit. There is no height or weight on file to calculate BMI. General Appearance:  Alert, cooperative, no distress, appears stated age  Head:  Normocephalic, without obvious abnormality, atraumatic  Eyes:  Conjunctiva clear, EOM's intact  Nose: Nares normal, hypertrophic turbinates, no visible anterior polyps, and septum midline  Throat: Lips, tongue normal; teeth and gums normal, normal posterior oropharynx and no tonsillar exudate  Neck: Supple, symmetrical  Lungs:   clear to auscultation bilaterally, Respirations unlabored, no coughing  Heart:  regular rate and rhythm and no murmur, Appears well perfused  Extremities: No edema  Skin: Skin color, texture, turgor normal, no rashes or lesions on visualized portions of skin   Neurologic: No gross deficits   Previous notes and tests were reviewed. The plan was reviewed with the patient/family, and all questions/concerned were addressed.  It was my pleasure to see Amy Pope today and participate in her care. Please feel free to contact me with any questions or concerns.  Sincerely,  Ferol Luz, MD  Allergy & Immunology  [facility] Office:  [phone]

## 2023-06-29 ENCOUNTER — Telehealth: Payer: Self-pay | Admitting: *Deleted

## 2023-06-29 NOTE — Telephone Encounter (Signed)
L/m for patient to advise approval, copay card and submit for Tezspire to [facility]

## 2023-07-01 NOTE — Telephone Encounter (Signed)
Spoke to patient and advised approval, copay card and submit for Tezspire to [facility]. Instructed on delivery, storage,dosing and admin instrux with next injection- 1st given  06/25/23

## 2023-07-12 ENCOUNTER — Other Ambulatory Visit (HOSPITAL_BASED_OUTPATIENT_CLINIC_OR_DEPARTMENT_OTHER): Payer: Self-pay

## 2023-07-12 MED ORDER — FREESTYLE LIBRE 3 SENSOR MISC
0 refills | Status: AC
  Filled 2023-07-12: qty 2, 28d supply, fill #0

## 2023-07-12 MED ORDER — MOUNJARO 5 MG/0.5ML ~~LOC~~ SOAJ
5.0000 mg | SUBCUTANEOUS | 0 refills | Status: DC
  Filled 2023-07-12: qty 2, 28d supply, fill #0

## 2023-07-20 ENCOUNTER — Encounter: Payer: Self-pay | Admitting: Internal Medicine

## 2023-07-23 ENCOUNTER — Ambulatory Visit (INDEPENDENT_AMBULATORY_CARE_PROVIDER_SITE_OTHER): Payer: BC Managed Care – PPO

## 2023-07-23 ENCOUNTER — Other Ambulatory Visit: Payer: Self-pay

## 2023-07-23 ENCOUNTER — Ambulatory Visit: Payer: BC Managed Care – PPO

## 2023-07-23 ENCOUNTER — Telehealth: Payer: Self-pay | Admitting: Internal Medicine

## 2023-07-23 DIAGNOSIS — J309 Allergic rhinitis, unspecified: Secondary | ICD-10-CM

## 2023-07-23 MED ORDER — AZELASTINE HCL 0.1 % NA SOLN: 2.0000 | Freq: Two times a day (BID) | NASAL | 11 refills | Status: DC

## 2023-07-23 NOTE — Telephone Encounter (Signed)
Patient is requesting a RX for azelastine (ASTELIN) 0.1 % nasal spray [[id number]]  please send to [facility] [address] [facility] please advise

## 2023-07-23 NOTE — Telephone Encounter (Signed)
Rx sent to requested pharmacy

## 2023-08-05 ENCOUNTER — Ambulatory Visit (INDEPENDENT_AMBULATORY_CARE_PROVIDER_SITE_OTHER): Payer: BC Managed Care – PPO

## 2023-08-05 DIAGNOSIS — J309 Allergic rhinitis, unspecified: Secondary | ICD-10-CM

## 2023-08-07 ENCOUNTER — Other Ambulatory Visit (HOSPITAL_BASED_OUTPATIENT_CLINIC_OR_DEPARTMENT_OTHER): Payer: Self-pay

## 2023-08-09 ENCOUNTER — Other Ambulatory Visit (HOSPITAL_BASED_OUTPATIENT_CLINIC_OR_DEPARTMENT_OTHER): Payer: Self-pay

## 2023-08-09 MED ORDER — MOUNJARO 7.5 MG/0.5ML ~~LOC~~ SOAJ
7.5000 mg | SUBCUTANEOUS | 0 refills | Status: DC
  Filled 2023-08-09: qty 2, 28d supply, fill #0

## 2023-08-20 ENCOUNTER — Ambulatory Visit (INDEPENDENT_AMBULATORY_CARE_PROVIDER_SITE_OTHER): Payer: BC Managed Care – PPO

## 2023-08-20 DIAGNOSIS — J309 Allergic rhinitis, unspecified: Secondary | ICD-10-CM | POA: Diagnosis not present

## 2023-08-31 ENCOUNTER — Encounter: Payer: Self-pay | Admitting: Internal Medicine

## 2023-09-03 ENCOUNTER — Ambulatory Visit (INDEPENDENT_AMBULATORY_CARE_PROVIDER_SITE_OTHER): Payer: BC Managed Care – PPO

## 2023-09-03 DIAGNOSIS — J309 Allergic rhinitis, unspecified: Secondary | ICD-10-CM | POA: Diagnosis not present

## 2023-09-05 ENCOUNTER — Other Ambulatory Visit (HOSPITAL_BASED_OUTPATIENT_CLINIC_OR_DEPARTMENT_OTHER): Payer: Self-pay

## 2023-09-06 ENCOUNTER — Other Ambulatory Visit: Payer: Self-pay

## 2023-09-06 ENCOUNTER — Other Ambulatory Visit (HOSPITAL_BASED_OUTPATIENT_CLINIC_OR_DEPARTMENT_OTHER): Payer: Self-pay

## 2023-09-06 MED ORDER — MOUNJARO 7.5 MG/0.5ML ~~LOC~~ SOAJ
7.5000 mg | SUBCUTANEOUS | 0 refills | Status: DC
  Filled 2023-09-06: qty 2, 28d supply, fill #0

## 2023-09-06 MED ORDER — IPRATROPIUM-ALBUTEROL 0.5-2.5 (3) MG/3ML IN SOLN: 3.0000 mL | RESPIRATORY_TRACT | 1 refills | Status: DC | PRN

## 2023-09-07 ENCOUNTER — Other Ambulatory Visit: Payer: Self-pay

## 2023-09-17 ENCOUNTER — Ambulatory Visit (INDEPENDENT_AMBULATORY_CARE_PROVIDER_SITE_OTHER): Payer: BC Managed Care – PPO

## 2023-09-17 DIAGNOSIS — J309 Allergic rhinitis, unspecified: Secondary | ICD-10-CM | POA: Diagnosis not present

## 2023-09-22 ENCOUNTER — Telehealth: Payer: Self-pay | Admitting: Internal Medicine

## 2023-09-22 MED ORDER — BREZTRI AEROSPHERE 160-9-4.8 MCG/ACT IN AERO: 2.0000 | INHALATION_SPRAY | Freq: Two times a day (BID) | RESPIRATORY_TRACT | 5 refills | Status: DC

## 2023-09-22 NOTE — Telephone Encounter (Signed)
Sent in breztril to [facility] [address]

## 2023-09-22 NOTE — Telephone Encounter (Signed)
Patient is requesting a refill on medication Budeson-Glycopyrrol-Formoterol (BREZTRI AEROSPHERE) 160-9-4.8 MCG/ACT AERO [[id number]]   [facility] to [facility] [address] in [facility]

## 2023-09-27 ENCOUNTER — Ambulatory Visit (INDEPENDENT_AMBULATORY_CARE_PROVIDER_SITE_OTHER): Payer: BC Managed Care – PPO

## 2023-09-27 DIAGNOSIS — J309 Allergic rhinitis, unspecified: Secondary | ICD-10-CM

## 2023-09-28 ENCOUNTER — Other Ambulatory Visit: Payer: Self-pay

## 2023-11-05 ENCOUNTER — Ambulatory Visit (INDEPENDENT_AMBULATORY_CARE_PROVIDER_SITE_OTHER): Payer: BC Managed Care – PPO

## 2023-11-05 DIAGNOSIS — J309 Allergic rhinitis, unspecified: Secondary | ICD-10-CM

## 2023-11-19 DIAGNOSIS — J3089 Other allergic rhinitis: Secondary | ICD-10-CM | POA: Diagnosis not present

## 2023-11-19 NOTE — Progress Notes (Signed)
EXP 11/18/24

## 2023-11-29 ENCOUNTER — Ambulatory Visit (INDEPENDENT_AMBULATORY_CARE_PROVIDER_SITE_OTHER): Payer: Self-pay

## 2023-11-29 DIAGNOSIS — J309 Allergic rhinitis, unspecified: Secondary | ICD-10-CM

## 2023-12-19 ENCOUNTER — Other Ambulatory Visit: Payer: Self-pay | Admitting: Internal Medicine

## 2023-12-30 ENCOUNTER — Ambulatory Visit (INDEPENDENT_AMBULATORY_CARE_PROVIDER_SITE_OTHER): Payer: BC Managed Care – PPO

## 2023-12-30 DIAGNOSIS — J309 Allergic rhinitis, unspecified: Secondary | ICD-10-CM

## 2023-12-31 ENCOUNTER — Ambulatory Visit: Payer: BC Managed Care – PPO | Admitting: Internal Medicine

## 2023-12-31 ENCOUNTER — Encounter: Payer: Self-pay | Admitting: Internal Medicine

## 2023-12-31 VITALS — BP 110/64 | HR 76 | Resp 18

## 2023-12-31 DIAGNOSIS — H1013 Acute atopic conjunctivitis, bilateral: Secondary | ICD-10-CM | POA: Diagnosis not present

## 2023-12-31 DIAGNOSIS — Z91018 Allergy to other foods: Secondary | ICD-10-CM

## 2023-12-31 DIAGNOSIS — J4550 Severe persistent asthma, uncomplicated: Secondary | ICD-10-CM

## 2023-12-31 DIAGNOSIS — J3089 Other allergic rhinitis: Secondary | ICD-10-CM | POA: Diagnosis not present

## 2023-12-31 DIAGNOSIS — H1010 Acute atopic conjunctivitis, unspecified eye: Secondary | ICD-10-CM

## 2023-12-31 DIAGNOSIS — M2559 Pain in other specified joint: Secondary | ICD-10-CM

## 2023-12-31 DIAGNOSIS — J302 Other seasonal allergic rhinitis: Secondary | ICD-10-CM

## 2023-12-31 NOTE — Progress Notes (Signed)
Follow Up Note  RE: Amy Pope MRN: [id number] DOB: 1976/03/07 Date of Office Visit: 12/31/2023  Referring provider: Galvin Proffer, MD Primary care provider: Galvin Proffer, MD  Chief Complaint: Allergic Rhinitis  and Asthma  History of Present Illness: I had the pleasure of seeing Amy Pope for a follow up visit at the [facility] on 12/31/2023. She is a 48 y.o. female, who is being followed for persistent asthma, alpha gal, allergic conjunctivitis. Her previous allergy office visit was on 06/23/23  with Dr. Marlynn Perking. Today is a regular follow up visit.  History obtained from patient, chart review.  At last visit asthma was not well controlled and biologic treatment with tezpire started.   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  Pains are worsening with each injection and she wants to stop Tezspire. Although does feel like it is helping her asthma.   Allergic Rhinitis - Medical therapy: astelin, zyrtec  - Symptoms: improved, will have nasal congestion when at work, using n95 mask to help - Adverse effects of medications: denies  - Allergy testing history: positive to  mold, cockroach, cat, dog, dust mite, and weed pollen - Immunotherapy: Vial 1 (m-cr), vial 2 (w-dm-c-d); last injection 01/29/23 of maintenance dose - Large Local Reactions: denies  - Systemic Reactions: denies  - Beta Blockers: denies  - History of Reflux denies  - History of Sinus  Surgery denies     Food Allergy: continues to avoid red meat and milk, is interested in retesting to introduce dairy  -denies  accidental exposures - denies use of epinephrine -Previous testing: 04/09/2022: Alpha-gal 0.23, beef 0.12, clam 0.12 -She is a vegetarian is not interested in introducing red meat at any point  Assessment and Plan: Amy Pope is a 48 y.o. female with: Severe persistent asthma without complication - Plan: Spirometry with Graph, CBC With Diff/Platelet  Allergy to alpha-gal - Plan: Alpha-Gal Panel  Seasonal and perennial allergic rhinitis  Seasonal allergic conjunctivitis  Pain in other joint Plan: Patient Instructions  Moderate persistent asthma: improved but side effects from Tezspire  Breathing tests today showed: improved   Will stop Tezspire due to joint pains  Will get labs to evaluate for anti-IL 5, will plan on fasenra    PLAN:  - Spacer use reviewed. - Daily controller medication(s):  Breztri 160 mcg 2 puffs twice daily - Prior to physical activity: albuterol 2 puffs 10-15 minutes before physical activity. - Rescue medications: albuterol 4 puffs every 4-6 hours as needed - Changes during respiratory infections or worsening symptoms: Add on Symbicort to 2 puffs twice daily for TWO WEEKS. - Asthma control goals:  * Full participation in all desired activities (may need albuterol before activity) * Albuterol use two time or less a week on average (not counting use with activity) * Cough interfering with sleep two time or less a month * Oral steroids no more than  once a year * No hospitalizations    Allergic rhinitis Continue allergen avoidance measures directed toward mold, cockroach, cat, dog, dust mite, and weed pollen Continue OTC antihistamines as needed  Continue azelastine 2 sprays in each nostril twice a day as needed for runny nose Consider saline nasal rinses as needed for nasal symptoms. Use this before any medicated nasal sprays for  best result Continue allergen immunotherapy and have success to an epinephrine autoinjector set.    Allergic conjunctivitis Continue Pataday 1 drop in each eye once a day as needed for red itchy eyes  Alpha gal allergy, will double check to see if you have outgrown  Continue to avoid mammalian meat, dairy, and products containing gelatin.  In case of an allergic reaction, take Benadryl 50 mg every 4 hours, and if life-threatening symptoms occur, inject with AuviQ 0.3 mg.   Follow up:  6 months   Thank you so much for letting me partake in your care today.  Don't hesitate to reach out if you have any additional concerns!  Ferol Luz, MD  [facility] [place] [place] No follow-ups on file.  No orders of the defined types were placed in this encounter.   Lab Orders         Alpha-Gal Panel         CBC With Diff/Platelet      Diagnostics: Spirometry:  Tracings reviewed. Her effort: Good reproducible efforts. FVC:  2.70L FEV1:  2.28L, 71% predicted FEV1/FVC ratio: 84% Interpretation: Spirometry consistent with possible restrictive defect  Please see scanned spirometry results for details.   Medication List:  Current Outpatient Medications  Medication Sig Dispense Refill   acetaminophen (TYLENOL) 500 MG tablet Take by mouth.     albuterol (VENTOLIN HFA) 108 (90 Base) MCG/ACT inhaler Inhale 1-2 puffs into the lungs every 4 (four) hours as needed for wheezing or shortness of breath (every 4-6 hours). 18 g 5   ALPRAZolam (XANAX) 1 MG tablet Take 1 tablet by mouth 2 (two) times daily.     azelastine (ASTELIN) 0.1 % nasal spray Place 2 sprays into both nostrils 2 (two) times daily. 30 mL 11   Budeson-Glycopyrrol-Formoterol (BREZTRI AEROSPHERE) 160-9-4.8 MCG/ACT AERO INHALE TWO PUFFS BY MOUTH TWICE A DAY (IN THE MORNING AND AT BEDTIME) 10.7 g 0   Continuous Glucose Sensor (FREESTYLE LIBRE 3 SENSOR) MISC Change sensor every 14 days 2 each 0   cyclobenzaprine  (FLEXERIL) 10 MG tablet 1 tablet as needed     EPINEPHrine 0.3 mg/0.3 mL IJ SOAJ injection Inject 0.3 mg into the muscle See admin instructions. 2 each 1   esomeprazole (NEXIUM) 20 MG capsule Take by mouth.     ibuprofen (ADVIL,MOTRIN) 800 MG tablet Take 800 mg by mouth every 8 (eight) hours as needed (duexis).     ipratropium-albuterol (DUONEB) 0.5-2.5 (3) MG/3ML SOLN Take 3 mLs by nebulization every 4 (four) hours as needed. 360 mL 1   levocetirizine (XYZAL) 5 MG tablet Take 1 tablet (5 mg total) by mouth daily. 30 tablet 0   meloxicam (MOBIC) 15 MG tablet 1 tablet     Multiple Vitamin (MULTIVITAMIN) tablet Take 1 tablet by mouth daily.     Omega-3 Fatty Acids (FISH OIL) 1000 MG CAPS 1 capsule     No current facility-administered medications for this visit.   Allergies: Allergies  Allergen Reactions   Morphine Swelling   Morphine And Codeine    Morphine Sulfate Other (See Comments)   I reviewed  her past medical history, social history, family history, and environmental history and no significant changes have been reported from her previous visit.  ROS: All others negative except as noted per HPI.   Objective: BP 110/64   Pulse 76   Resp 18   SpO2 99%  There is no height or weight on file to calculate BMI. General Appearance:  Alert, cooperative, no distress, appears stated age  Head:  Normocephalic, without obvious abnormality, atraumatic  Eyes:  Conjunctiva clear, EOM's intact  Nose: Nares normal, hypertrophic turbinates, no visible anterior polyps, and septum midline  Throat: Lips, tongue normal; teeth and gums normal, normal posterior oropharynx and no tonsillar exudate  Neck: Supple, symmetrical  Lungs:   clear to auscultation bilaterally, Respirations unlabored, no coughing  Heart:  regular rate and rhythm and no murmur, Appears well perfused  Extremities: No edema  Skin: Skin color, texture, turgor normal, no rashes or lesions on visualized portions of skin    Neurologic: No gross deficits   Previous notes and tests were reviewed. The plan was reviewed with the patient/family, and all questions/concerned were addressed.  It was my pleasure to see Amy Pope today and participate in her care. Please feel free to contact me with any questions or concerns.  Sincerely,  Ferol Luz, MD  Allergy & Immunology  [facility] Office: [phone]

## 2023-12-31 NOTE — Patient Instructions (Addendum)
Moderate persistent asthma: improved but side effects from Tezspire  Breathing tests today showed: improved   Will stop Tezspire due to joint pains  Will get labs to evaluate for anti-IL 5, will plan on fasenra    PLAN:  - Spacer use reviewed. - Daily controller medication(s):  Breztri 160 mcg 2 puffs twice daily - Prior to physical activity: albuterol 2 puffs 10-15 minutes before physical activity. - Rescue medications: albuterol 4 puffs every 4-6 hours as needed - Changes during respiratory infections or worsening symptoms: Add on Symbicort to 2 puffs twice daily for TWO WEEKS. - Asthma control goals:  * Full participation in all desired activities (may need albuterol before activity) * Albuterol use two time or less a week on average (not counting use with activity) * Cough interfering with sleep two time or less a month * Oral steroids no more than once a year * No hospitalizations    Allergic rhinitis Continue allergen avoidance measures directed toward mold, cockroach, cat, dog, dust mite, and weed pollen Continue OTC antihistamines as needed  Continue azelastine 2 sprays in each nostril twice a day as needed for runny nose Consider saline nasal rinses as needed for nasal symptoms. Use this before any medicated nasal sprays for best result Continue allergen immunotherapy and have success to an epinephrine autoinjector set.    Allergic conjunctivitis Continue Pataday 1 drop in each eye once a day as needed for red itchy eyes  Alpha gal allergy, will double check to see if you have outgrown  Continue to avoid mammalian meat, dairy, and products containing gelatin.  In case of an allergic reaction, take Benadryl 50 mg every 4 hours, and if life-threatening symptoms occur, inject with AuviQ 0.3 mg.   Follow up:  6 months   Thank you so much for letting me partake in your care today.  Don't hesitate to reach out if you have any additional concerns!  Ferol Luz, MD   [facility] [place], [place]

## 2024-01-04 LAB — ALPHA-GAL PANEL: IgE (Immunoglobulin E), Serum: 77 [IU]/mL (ref 6–495)

## 2024-01-04 LAB — CBC WITH DIFF/PLATELET
Basophils Absolute: 0.1 10*3/uL (ref 0.0–0.2)
Basos: 1 %
EOS (ABSOLUTE): 0 10*3/uL (ref 0.0–0.4)
Eos: 1 %
Hematocrit: 41.5 % (ref 34.0–46.6)
Hemoglobin: 13.3 g/dL (ref 11.1–15.9)
Immature Grans (Abs): 0 10*3/uL (ref 0.0–0.1)
Immature Granulocytes: 0 %
Lymphocytes Absolute: 1.4 10*3/uL (ref 0.7–3.1)
Lymphs: 23 %
MCH: 29.5 pg (ref 26.6–33.0)
MCHC: 32 g/dL (ref 31.5–35.7)
MCV: 92 fL (ref 79–97)
Monocytes Absolute: 0.3 10*3/uL (ref 0.1–0.9)
Monocytes: 5 %
Neutrophils Absolute: 4.4 10*3/uL (ref 1.4–7.0)
Neutrophils: 70 %
Platelets: 381 10*3/uL (ref 150–450)
RBC: 4.51 x10E6/uL (ref 3.77–5.28)
RDW: 12.8 % (ref 11.7–15.4)
WBC: 6.2 10*3/uL (ref 3.4–10.8)

## 2024-01-18 ENCOUNTER — Other Ambulatory Visit: Payer: Self-pay | Admitting: Plastic Surgery

## 2024-01-18 DIAGNOSIS — Z1231 Encounter for screening mammogram for malignant neoplasm of breast: Secondary | ICD-10-CM

## 2024-02-09 ENCOUNTER — Ambulatory Visit (INDEPENDENT_AMBULATORY_CARE_PROVIDER_SITE_OTHER): Payer: Self-pay

## 2024-02-09 DIAGNOSIS — J309 Allergic rhinitis, unspecified: Secondary | ICD-10-CM

## 2024-02-17 ENCOUNTER — Ambulatory Visit (INDEPENDENT_AMBULATORY_CARE_PROVIDER_SITE_OTHER): Payer: Self-pay

## 2024-02-17 DIAGNOSIS — J309 Allergic rhinitis, unspecified: Secondary | ICD-10-CM

## 2024-02-18 ENCOUNTER — Ambulatory Visit: Payer: BC Managed Care – PPO

## 2024-03-10 ENCOUNTER — Ambulatory Visit
Admission: RE | Admit: 2024-03-10 | Discharge: 2024-03-10 | Disposition: A | Discharge status: Home / Self Care | Source: Ambulatory Visit | Attending: Plastic Surgery | Admitting: Plastic Surgery

## 2024-03-10 DIAGNOSIS — Z1231 Encounter for screening mammogram for malignant neoplasm of breast: Secondary | ICD-10-CM

## 2024-03-24 ENCOUNTER — Ambulatory Visit (INDEPENDENT_AMBULATORY_CARE_PROVIDER_SITE_OTHER): Payer: Self-pay

## 2024-03-24 DIAGNOSIS — J309 Allergic rhinitis, unspecified: Secondary | ICD-10-CM | POA: Diagnosis not present

## 2024-03-31 ENCOUNTER — Ambulatory Visit (INDEPENDENT_AMBULATORY_CARE_PROVIDER_SITE_OTHER): Payer: Self-pay

## 2024-03-31 DIAGNOSIS — J309 Allergic rhinitis, unspecified: Secondary | ICD-10-CM

## 2024-04-07 ENCOUNTER — Ambulatory Visit (INDEPENDENT_AMBULATORY_CARE_PROVIDER_SITE_OTHER): Payer: Self-pay | Admitting: *Deleted

## 2024-04-07 DIAGNOSIS — J309 Allergic rhinitis, unspecified: Secondary | ICD-10-CM | POA: Diagnosis not present

## 2024-05-05 ENCOUNTER — Ambulatory Visit (INDEPENDENT_AMBULATORY_CARE_PROVIDER_SITE_OTHER): Payer: Self-pay

## 2024-05-05 DIAGNOSIS — J309 Allergic rhinitis, unspecified: Secondary | ICD-10-CM | POA: Diagnosis not present

## 2024-05-29 ENCOUNTER — Ambulatory Visit (INDEPENDENT_AMBULATORY_CARE_PROVIDER_SITE_OTHER)

## 2024-05-29 DIAGNOSIS — J309 Allergic rhinitis, unspecified: Secondary | ICD-10-CM | POA: Diagnosis not present

## 2024-06-28 ENCOUNTER — Encounter: Payer: Self-pay | Admitting: Internal Medicine

## 2024-06-28 ENCOUNTER — Ambulatory Visit: Payer: BC Managed Care – PPO | Admitting: Internal Medicine

## 2024-06-28 VITALS — BP 128/72 | HR 81 | Temp 98.2°F | Resp 20 | Wt 230.0 lb

## 2024-06-28 DIAGNOSIS — Z91018 Allergy to other foods: Secondary | ICD-10-CM | POA: Diagnosis not present

## 2024-06-28 DIAGNOSIS — J3089 Other allergic rhinitis: Secondary | ICD-10-CM | POA: Diagnosis not present

## 2024-06-28 DIAGNOSIS — J4550 Severe persistent asthma, uncomplicated: Secondary | ICD-10-CM | POA: Diagnosis not present

## 2024-06-28 DIAGNOSIS — J302 Other seasonal allergic rhinitis: Secondary | ICD-10-CM

## 2024-06-28 DIAGNOSIS — H1045 Other chronic allergic conjunctivitis: Secondary | ICD-10-CM | POA: Diagnosis not present

## 2024-06-28 MED ORDER — METHYLPREDNISOLONE ACETATE 40 MG/ML IJ SUSP
40.0000 mg | Freq: Once | INTRAMUSCULAR | Status: AC
  Administered 2024-06-28: 40 mg via INTRAMUSCULAR

## 2024-06-28 MED ORDER — AZELASTINE HCL 0.1 % NA SOLN: 2.0000 | Freq: Two times a day (BID) | NASAL | 11 refills | Status: DC

## 2024-06-28 MED ORDER — EPINEPHRINE 0.3 MG/0.3ML IJ SOAJ: 0.3000 mg | INTRAMUSCULAR | 1 refills | Status: AC

## 2024-06-28 MED ORDER — BREZTRI AEROSPHERE 160-9-4.8 MCG/ACT IN AERO: 2.0000 | INHALATION_SPRAY | Freq: Two times a day (BID) | RESPIRATORY_TRACT | 5 refills | Status: DC

## 2024-06-28 MED ORDER — CETIRIZINE HCL 10 MG PO TABS: 10.0000 mg | ORAL_TABLET | Freq: Every day | ORAL | 5 refills | Status: DC

## 2024-06-28 MED ORDER — TRIAMCINOLONE ACETONIDE 0.1 % EX OINT: TOPICAL_OINTMENT | CUTANEOUS | 1 refills | Status: AC

## 2024-06-28 NOTE — Patient Instructions (Addendum)
 Severe Persistent asthma  Breathing tests today showed:  decreased by 20%  Will get repeat CBC to evaluate for anti-IL 5 agents now that you have been of tezspire  for 6 months   Depo medrol  40mg  IM given today   Avoid smoke in all forms    PLAN:  - Spacer use reviewed. - Daily controller medication(s): Breztri  160 mcg 2 puffs twice daily - Prior to physical activity: albuterol  2 puffs 10-15 minutes before physical activity. - Rescue medications: albuterol  4 puffs every 4-6 hours as needed - Changes during respiratory infections or worsening symptoms: Add on Symbicort  to 2 puffs twice daily for TWO WEEKS. - Asthma control goals:  * Full participation in all desired activities (may need albuterol  before activity) * Albuterol  use two time or less a week on average (not counting use with activity) * Cough interfering with sleep two time or less a month * Oral steroids no more than once a year * No hospitalizations    Allergic rhinitis Continue allergen avoidance measures directed toward mold, cockroach, cat, dog, dust mite, and weed pollen Continue Cetirizine  28m daily (can take extra dose up to 4 tabs a day for bad symptoms)  Continue azelastine  2 sprays in each nostril twice a day as needed for runny nose Consider saline nasal rinses as needed for nasal symptoms. Use this before any medicated nasal sprays for best result Continue allergen immunotherapy and have success to an epinephrine  autoinjector set.    Allergic conjunctivitis Continue Pataday  1 drop in each eye once a day as needed for red itchy eyes  Alpha gal allergy,  Continue to avoid mammalian meat, dairy, and products containing gelatin.  In case of an allergic reaction, take Benadryl 50 mg every 4 hours, and if life-threatening symptoms occur, inject with AuviQ 0.3 mg.  Mosquito/insect bites Avoidance measures (DEET repellant for mosquitos, long clothing, etc) If bite occurs with raised rash:  - For itch: Topical  steroid (Triamcinolone  0.1% ointment ) twice daily as needed + oral antihistamine (zyrtec ) - For pain and swelling: Oral anti-inflammatory (ibuprofen), ice affected area       Follow up:  3  months   Thank you so much for letting me partake in your care today.  Don't hesitate to reach out if you have any additional concerns!  Hargis Springer, MD  [facility] [place], [place]

## 2024-06-28 NOTE — Progress Notes (Signed)
 Follow Up Note  RE: Amy Pope MRN: [id number] DOB: Jul 26, 1976 Date of Office Visit: 06/28/2024  Referring provider: Pia Kerney SQUIBB, MD Primary care provider: Pia Kerney SQUIBB, MD  Chief Complaint: Follow-up (6 month follow Asthma, allergies, and atopic dermatitis)  History of Present Illness: I had the pleasure of seeing Amy Pope for a follow up visit at the [facility] on 06/29/2024. She is a 48 y.o. female, who is being followed for persistent asthma, alpha gal, allergic conjunctivitis. Her previous allergy office visit was on 12/31/23 with Dr. Lorin. Today is a regular follow up visit.  History obtained from patient, chart review.  At last visit asthma was not well controlled and biologic treatment with tezpire started.   Today she reports   Discussed the use of AI scribe software for clinical note transcription with the patient, who gave verbal consent to proceed.  History of Present Illness Amy Pope is a 48 year old female with asthma who presents with worsening respiratory symptoms.  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), with known dog allergy contributing to symptom exacerbation - Mother smokes, resulting in secondhand smoke exposure despite efforts to avoid  Upper respiratory symptoms - Sinus symptoms are worse today  Cutaneous hypersensitivity reactions - Hives and bug bites, primarily on legs and back, attributed to environmental exposures - Blotchy and red facial skin - Uses natural citronella spray for symptom  management  Allergic rhinitis - Taking Claritin for allergy symptoms  Psychosocial stressors - Significant stress from caring for mother with cancer and managing business - Feels overwhelmed by responsibilities - Husband frequently out of town for work    Asthma - Medical therapy: Breztri  160 mcg 2 puffs twice daily, albuterol  Hfa and nebulizer -She ran out of her Breztri  1 month ago, switched to her Symbicort  and ran out of that 1 week ago. 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  Allergic Rhinitis - Medical therapy: astelin , Claritin 1-2 daily - Symptoms: Have worsened but she reports forgetting to take her daily antihistamines, now with nasal congestion, contact urticaria, red itchy eyes - Adverse effects of medications: denies  - Allergy testing history: positive to  mold, cockroach, cat, dog, dust mite, and weed pollen - Immunotherapy: Vial 1 (m-cr), vial 2 (w-dm-c-d); on maintenance - Large Local Reactions: denies  - Systemic Reactions: denies  - Beta Blockers: denies  - History of Reflux denies  - History of Sinus Surgery denies     Food Allergy: continues to avoid red meat and milk, is interested in retesting to introduce dairy  -denies  accidental exposures - denies use of epinephrine  -Previous testing: 04/09/2022: Alpha-gal 0.23, beef  0.12, clam 0.12 -She is a vegetarian is not interested in introducing red meat at any point  Assessment and Plan: Amely is a 48 y.o. female with: Poorly controlled severe persistent asthma without complication - Plan: Spirometry with Graph,  CBC With Diff/Platelet  Seasonal and perennial allergic rhinitis  Allergy to alpha-gal  Other chronic allergic conjunctivitis of both eyes Plan: Patient Instructions  Severe Persistent asthma  Breathing tests today showed:  decreased by 20%  Will get repeat CBC to evaluate for anti-IL 5 agents now that you have been of tezspire  for 6 months   Depo medrol  40mg  IM given today   Avoid smoke in all forms    PLAN:  - Spacer use reviewed. - Daily controller medication(s): Breztri  160 mcg 2 puffs twice daily - Prior to physical activity: albuterol  2 puffs 10-15 minutes before physical activity. - Rescue medications: albuterol  4 puffs every 4-6 hours as needed - Changes during respiratory infections or worsening symptoms: Add on Symbicort  to 2 puffs twice daily for TWO WEEKS. - Asthma control goals:  * Full participation in all desired activities (may need albuterol  before activity) * Albuterol  use two time or less a week on average (not counting use with activity) * Cough interfering with sleep two time or less a month * Oral steroids no more than once a year * No hospitalizations    Allergic rhinitis Continue allergen avoidance measures directed toward mold, cockroach, cat, dog, dust mite, and weed pollen Continue Cetirizine  86m daily (can take extra dose up to 4 tabs a day for bad symptoms)  Continue azelastine  2 sprays in each nostril twice a day as needed for runny nose Consider saline nasal rinses as needed for nasal symptoms. Use this before any medicated nasal sprays for best result Continue allergen immunotherapy and have success to an epinephrine  autoinjector set.    Allergic conjunctivitis Continue Pataday  1 drop in each eye once a day as needed for red itchy eyes  Alpha gal allergy,  Continue to avoid mammalian meat, dairy, and products containing gelatin.  In case of an allergic reaction, take Benadryl 50 mg every 4 hours, and if life-threatening symptoms occur,  inject with AuviQ 0.3 mg.  Mosquito/insect bites Avoidance measures (DEET repellant for mosquitos, long clothing, etc) If bite occurs with raised rash:  - For itch: Topical steroid (Triamcinolone  0.1% ointment ) twice daily as needed + oral antihistamine (zyrtec ) - For pain and swelling: Oral anti-inflammatory (ibuprofen), ice affected area       Follow up:  3  months   Thank you so much for letting me partake in your care today.  Don't hesitate to reach out if you have any additional concerns!  Hargis Springer, MD  [facility] [place] [place] No follow-ups on file.  Meds ordered this encounter  Medications   cetirizine  (ZYRTEC ) 10 MG tablet    Sig: Take 1 tablet (10 mg total) by mouth daily.    Dispense:  30 tablet    Refill:  5   EPINEPHrine  0.3 mg/0.3 mL IJ SOAJ injection    Sig: Inject 0.3 mg into the muscle See admin instructions.    Dispense:  2 each    Refill:  1   budesonide -glycopyrrolate-formoterol  (BREZTRI  AEROSPHERE) 160-9-4.8 MCG/ACT AERO inhaler    Sig: Inhale 2 puffs into the lungs 2 (two) times daily.    Dispense:  10.7 g    Refill:  5   azelastine  (ASTELIN ) 0.1 % nasal spray  Sig: Place 2 sprays into both nostrils 2 (two) times daily.    Dispense:  30 mL    Refill:  11   triamcinolone  ointment (KENALOG ) 0.1 %    Sig: Apply topically twice daily to BODY as needed for red, sandpaper like rash.  Do not use on face, groin or armpits.    Dispense:  80 g    Refill:  1   methylPREDNISolone  acetate (DEPO-MEDROL ) injection 40 mg   Olopatadine  HCl 0.2 % SOLN    Sig: Apply 1 drop to eye 2 (two) times daily.    Dispense:  2.5 mL    Refill:  5    Lab Orders         CBC With Diff/Platelet       Diagnostics: Spirometry:  Tracings reviewed. Her effort: Good reproducible efforts. FVC:  2.42L FEV1:  1.90L, 56% predicted FEV1/FVC ratio: 79% Interpretation: Spirometry consistent with possible restrictive defect  Please see scanned spirometry  results for details.   Medication List:  Current Outpatient Medications  Medication Sig Dispense Refill   acetaminophen  (TYLENOL ) 500 MG tablet Take by mouth.     albuterol  (VENTOLIN  HFA) 108 (90 Base) MCG/ACT inhaler Inhale 1-2 puffs into the lungs every 4 (four) hours as needed for wheezing or shortness of breath (every 4-6 hours). 18 g 5   ALPRAZolam (XANAX) 1 MG tablet Take 1 tablet by mouth 2 (two) times daily.     cetirizine  (ZYRTEC ) 10 MG tablet Take 1 tablet (10 mg total) by mouth daily. 30 tablet 5   Continuous Glucose Sensor (FREESTYLE LIBRE 3 SENSOR) MISC Change sensor every 14 days 2 each 0   cyclobenzaprine (FLEXERIL) 10 MG tablet 1 tablet as needed     esomeprazole (NEXIUM) 20 MG capsule Take by mouth.     ibuprofen (ADVIL,MOTRIN) 800 MG tablet Take 800 mg by mouth every 8 (eight) hours as needed (duexis).     ipratropium-albuterol  (DUONEB) 0.5-2.5 (3) MG/3ML SOLN Take 3 mLs by nebulization every 4 (four) hours as needed. 360 mL 1   meloxicam (MOBIC) 15 MG tablet 1 tablet     Multiple Vitamin (MULTIVITAMIN) tablet Take 1 tablet by mouth daily.     Olopatadine  HCl 0.2 % SOLN Apply 1 drop to eye 2 (two) times daily. 2.5 mL 5   Omega-3 Fatty Acids (FISH OIL) 1000 MG CAPS 1 capsule     triamcinolone  ointment (KENALOG ) 0.1 % Apply topically twice daily to BODY as needed for red, sandpaper like rash.  Do not use on face, groin or armpits. 80 g 1   azelastine  (ASTELIN ) 0.1 % nasal spray Place 2 sprays into both nostrils 2 (two) times daily. 30 mL 11   budesonide -glycopyrrolate-formoterol  (BREZTRI  AEROSPHERE) 160-9-4.8 MCG/ACT AERO inhaler Inhale 2 puffs into the lungs 2 (two) times daily. 10.7 g 5   EPINEPHrine  0.3 mg/0.3 mL IJ SOAJ injection Inject 0.3 mg into the muscle See admin instructions. 2 each 1   No current facility-administered medications for this visit.   Allergies: Allergies  Allergen Reactions   Morphine Swelling   Morphine And Codeine    Morphine Sulfate Other  (See Comments)   I reviewed her past medical history, social history, family history, and environmental history and no significant changes have been reported from her previous visit.  ROS: All others negative except as noted per HPI.   Objective: BP 128/72 (BP Location: Left Arm, Patient Position: Sitting, Cuff Size: Large)   Pulse 81   Temp 98.2 F (  36.8 C) (Oral)   Resp 20   Wt 230 lb (104.3 kg)   SpO2 94%   BMI 34.97 kg/m  Body mass index is 34.97 kg/m. General Appearance:  Alert, cooperative, no distress, appears stated age  Head:  Normocephalic, without obvious abnormality, atraumatic  Eyes:  Conjunctiva clear, EOM's intact  Nose: Nares normal, hypertrophic turbinates, no visible anterior polyps, and septum midline  Throat: Lips, tongue normal; teeth and gums normal, normal posterior oropharynx and no tonsillar exudate  Neck: Supple, symmetrical  Lungs:   clear to auscultation bilaterally, Respirations unlabored, intermittent dry coughing  Heart:  regular rate and rhythm and no murmur, Appears well perfused  Extremities: No edema  Skin: Skin color, texture, turgor normal, no rashes or lesions on visualized portions of skin   Neurologic: No gross deficits   Previous notes and tests were reviewed. The plan was reviewed with the patient/family, and all questions/concerned were addressed.  It was my pleasure to see Amy Pope today and participate in her care. Please feel free to contact me with any questions or concerns.  Sincerely,  Hargis Springer, MD  Allergy & Immunology  [facility] [place]  [place] Office: [phone]

## 2024-06-29 LAB — CBC WITH DIFF/PLATELET
Basophils Absolute: 0.1 x10E3/uL (ref 0.0–0.2)
Basos: 1 %
EOS (ABSOLUTE): 0.1 x10E3/uL (ref 0.0–0.4)
Eos: 2 %
Hematocrit: 40.4 % (ref 34.0–46.6)
Hemoglobin: 12.5 g/dL (ref 11.1–15.9)
Immature Grans (Abs): 0 x10E3/uL (ref 0.0–0.1)
Immature Granulocytes: 0 %
Lymphocytes Absolute: 2.1 x10E3/uL (ref 0.7–3.1)
Lymphs: 29 %
MCH: 29.9 pg (ref 26.6–33.0)
MCHC: 30.9 g/dL — ABNORMAL LOW (ref 31.5–35.7)
MCV: 97 fL (ref 79–97)
Monocytes Absolute: 0.5 x10E3/uL (ref 0.1–0.9)
Monocytes: 7 %
Neutrophils Absolute: 4.6 x10E3/uL (ref 1.4–7.0)
Neutrophils: 61 %
Platelets: 347 x10E3/uL (ref 150–450)
RBC: 4.18 x10E6/uL (ref 3.77–5.28)
RDW: 13.5 % (ref 11.7–15.4)
WBC: 7.5 x10E3/uL (ref 3.4–10.8)

## 2024-06-29 MED ORDER — OLOPATADINE HCL 0.2 % OP SOLN: 1.0000 [drp] | Freq: Two times a day (BID) | OPHTHALMIC | 5 refills | Status: AC

## 2024-07-07 ENCOUNTER — Other Ambulatory Visit: Payer: Self-pay | Admitting: Medical Genetics

## 2024-07-07 ENCOUNTER — Ambulatory Visit (INDEPENDENT_AMBULATORY_CARE_PROVIDER_SITE_OTHER): Payer: Self-pay

## 2024-07-07 DIAGNOSIS — J3089 Other allergic rhinitis: Secondary | ICD-10-CM | POA: Diagnosis not present

## 2024-07-07 DIAGNOSIS — J302 Other seasonal allergic rhinitis: Secondary | ICD-10-CM | POA: Diagnosis not present

## 2024-07-12 ENCOUNTER — Other Ambulatory Visit: Payer: Self-pay | Admitting: *Deleted

## 2024-07-12 MED ORDER — AZELASTINE HCL 0.1 % NA SOLN: 2.0000 | Freq: Two times a day (BID) | NASAL | 11 refills | Status: DC

## 2024-08-11 ENCOUNTER — Other Ambulatory Visit (HOSPITAL_COMMUNITY)

## 2024-08-18 ENCOUNTER — Ambulatory Visit (INDEPENDENT_AMBULATORY_CARE_PROVIDER_SITE_OTHER)

## 2024-08-18 DIAGNOSIS — J3089 Other allergic rhinitis: Secondary | ICD-10-CM

## 2024-08-18 DIAGNOSIS — J302 Other seasonal allergic rhinitis: Secondary | ICD-10-CM | POA: Diagnosis not present

## 2024-09-18 ENCOUNTER — Other Ambulatory Visit: Payer: Self-pay | Admitting: Medical Genetics

## 2024-09-18 DIAGNOSIS — Z006 Encounter for examination for normal comparison and control in clinical research program: Secondary | ICD-10-CM

## 2024-10-02 ENCOUNTER — Ambulatory Visit: Admitting: Internal Medicine

## 2024-10-02 ENCOUNTER — Other Ambulatory Visit: Payer: Self-pay

## 2024-10-02 VITALS — BP 130/80 | HR 79 | Temp 98.1°F | Resp 18 | Ht 69.0 in | Wt 233.3 lb

## 2024-10-02 DIAGNOSIS — H1045 Other chronic allergic conjunctivitis: Secondary | ICD-10-CM

## 2024-10-02 DIAGNOSIS — J3089 Other allergic rhinitis: Secondary | ICD-10-CM

## 2024-10-02 DIAGNOSIS — Z91018 Allergy to other foods: Secondary | ICD-10-CM | POA: Diagnosis not present

## 2024-10-02 DIAGNOSIS — J302 Other seasonal allergic rhinitis: Secondary | ICD-10-CM | POA: Diagnosis not present

## 2024-10-02 DIAGNOSIS — J4550 Severe persistent asthma, uncomplicated: Secondary | ICD-10-CM | POA: Diagnosis not present

## 2024-10-02 NOTE — Progress Notes (Unsigned)
 Follow Up Note  RE: Amy Pope MRN: [id number] DOB: 1976-02-24 Date of Office Visit: 10/02/2024  Referring provider: Pia Kerney SQUIBB, MD Primary care provider: Pia Kerney SQUIBB, MD  Chief Complaint: No chief complaint on file.  History of Present Illness: I had the pleasure of seeing Amy Pope for a follow up visit at the [facility] on 10/02/2024. She is a 48 y.o. female, who is being followed for persistent asthma, alpha gal, allergic conjunctivitis. Her previous allergy office visit was on 06/28/24 with Dr. Lorin. Today is a regular follow up visit.  History obtained from patient, chart review.  At last visit asthma was not well controlled treated with depo medrol  40mg  IM, got labs for anti-IL 5 agents, but AEC 100  Today she reports   Discussed the use of AI scribe software for clinical note transcription with the patient, who gave verbal consent to proceed.   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  - PreviousFEV1:  2.28L, 71% predicted - Biologic Labs: AEC 200 (02/05/23), total IgE 55; AEC 100 (06/28/24)  - Previously on Tezspire  had to stop due to joint pains  in 2025  Allergic Rhinitis - Medical therapy: astelin , Claritin 1-2 daily - Symptoms: Have worsened but she reports forgetting to take her daily antihistamines, now with nasal congestion, contact urticaria, red itchy eyes - Adverse effects of medications: denies  - Allergy testing history: positive to  mold, cockroach, cat, dog, dust mite, and weed pollen - Immunotherapy: Vial 1 (m-cr), vial 2 (w-dm-c-d); on maintenance - Large Local Reactions: denies  - Systemic Reactions: denies   - Beta Blockers: denies  - History of Reflux denies  - History of Sinus Surgery denies     Food Allergy: continues to avoid red meat and limits dairy  - developed urticaria, crampy abdominal pain with cheese pizza - denies use of epinephrine  - Previous testing: 04/09/2022: Alpha-gal 0.23, beef 0.12, clam 0.12 -She is a vegetarian is not interested in introducing red meat at any point  Assessment and Plan: Amy Pope is a 47 y.o. female with: No diagnosis found. Plan: There are no Patient Instructions on file for this visit. No follow-ups on file.  No orders of the defined types were placed in this encounter.   Lab Orders  No laboratory test(s) ordered today      Diagnostics: Spirometry:  Tracings reviewed. Her effort: Good reproducible efforts. FVC:  2.49L FEV1:  2.01L, 63% predicted FEV1/FVC ratio: 81% Interpretation: Spirometry consistent with possible restrictive defect  Please see scanned spirometry results for details.   Medication List:  Current Outpatient Medications  Medication Sig Dispense Refill   acetaminophen  (TYLENOL ) 500 MG tablet Take by mouth.     albuterol  (VENTOLIN  HFA) 108 (90 Base) MCG/ACT inhaler Inhale 1-2 puffs into the lungs every 4 (four) hours as needed for wheezing or shortness of breath (every 4-6 hours). 18 g 5   ALPRAZolam (XANAX) 1 MG tablet Take 1 tablet by mouth 2 (two) times daily.     azelastine  (ASTELIN ) 0.1 % nasal spray Place 2 sprays into both nostrils 2 (two) times daily. 30 mL 11  budesonide -glycopyrrolate-formoterol  (BREZTRI  AEROSPHERE) 160-9-4.8 MCG/ACT AERO inhaler Inhale 2 puffs into the lungs 2 (two) times daily. 10.7 g 5   cetirizine  (ZYRTEC ) 10 MG tablet Take 1 tablet (10 mg total) by mouth daily. 30 tablet 5   Continuous Glucose Sensor (FREESTYLE LIBRE 3 SENSOR) MISC Change sensor every 14 days 2 each 0   cyclobenzaprine (FLEXERIL) 10 MG tablet 1 tablet as needed     EPINEPHrine  0.3 mg/0.3 mL IJ SOAJ injection Inject 0.3 mg  into the muscle See admin instructions. 2 each 1   esomeprazole (NEXIUM) 20 MG capsule Take by mouth.     ibuprofen (ADVIL,MOTRIN) 800 MG tablet Take 800 mg by mouth every 8 (eight) hours as needed (duexis).     ipratropium-albuterol  (DUONEB) 0.5-2.5 (3) MG/3ML SOLN Take 3 mLs by nebulization every 4 (four) hours as needed. 360 mL 1   meloxicam (MOBIC) 15 MG tablet 1 tablet     Multiple Vitamin (MULTIVITAMIN) tablet Take 1 tablet by mouth daily.     Olopatadine  HCl 0.2 % SOLN Apply 1 drop to eye 2 (two) times daily. 2.5 mL 5   Omega-3 Fatty Acids (FISH OIL) 1000 MG CAPS 1 capsule     triamcinolone  ointment (KENALOG ) 0.1 % Apply topically twice daily to BODY as needed for red, sandpaper like rash.  Do not use on face, groin or armpits. 80 g 1   No current facility-administered medications for this visit.   Allergies: Allergies  Allergen Reactions   Morphine Swelling   Morphine And Codeine    Morphine Sulfate Other (See Comments)   I reviewed her past medical history, social history, family history, and environmental history and no significant changes have been reported from her previous visit.  ROS: All others negative except as noted per HPI.   Objective: There were no vitals taken for this visit. There is no height or weight on file to calculate BMI. General Appearance:  Alert, cooperative, no distress, appears stated age  Head:  Normocephalic, without obvious abnormality, atraumatic  Eyes:  Conjunctiva clear, EOM's intact  Nose: Nares normal, hypertrophic turbinates, no visible anterior polyps, and septum midline  Throat: Lips, tongue normal; teeth and gums normal, normal posterior oropharynx and no tonsillar exudate  Neck: Supple, symmetrical  Lungs:   clear to auscultation bilaterally, Respirations unlabored, intermittent dry coughing  Heart:  regular rate and rhythm and no murmur, Appears well perfused  Extremities: No edema  Skin: Skin color, texture, turgor normal, no rashes  or lesions on visualized portions of skin   Neurologic: No gross deficits   Previous notes and tests were reviewed. The plan was reviewed with the patient/family, and all questions/concerned were addressed.  It was my pleasure to see Amy Pope today and participate in her care. Please feel free to contact me with any questions or concerns.  Sincerely,  Hargis Springer, MD  Allergy & Immunology  [facility]  [place] Office: [phone]

## 2024-10-02 NOTE — Patient Instructions (Addendum)
 Severe Persistent asthma  Breathing tests today showed:  looked stable today  Could consider xolair or try for anti-IL-5 agent despite lower AEC if asthma worsens    PLAN:  - Spacer use reviewed. - Daily controller medication(s): Breztri  160 mcg 2 puffs twice daily - Prior to physical activity: albuterol  2 puffs 10-15 minutes before physical activity. - Rescue medications: albuterol  4 puffs every 4-6 hours as needed - Changes during respiratory infections or worsening symptoms: Add on Symbicort  to 2 puffs twice daily for TWO WEEKS. - Asthma control goals:  * Full participation in all desired activities (may need albuterol  before activity) * Albuterol  use two time or less a week on average (not counting use with activity) * Cough interfering with sleep two time or less a month * Oral steroids no more than once a year * No hospitalizations    Allergic rhinitis Continue allergen avoidance measures directed toward mold, cockroach, cat, dog, dust mite, and weed pollen Continue Claritin 10mg  daily (can take extra dose up to 4 tabs a day for bad symptoms)  Continue azelastine  2 sprays in each nostril twice a day as needed for runny nose Consider saline nasal rinses as needed for nasal symptoms. Use this before any medicated nasal sprays for best result Continue allergen immunotherapy and have success to an epinephrine  autoinjector set.    Allergic conjunctivitis Continue Pataday  1 drop in each eye once a day as needed for red itchy eyes  Alpha gal allergy,  Continue to avoid mammalian meat, dairy, and products containing gelatin.  In case of an allergic reaction, take Benadryl 50 mg every 4 hours, and if life-threatening symptoms occur, inject with AuviQ 0.3 mg.  Mosquito/insect bites Avoidance measures (DEET repellant for mosquitos, long clothing, etc) If bite occurs with raised rash:  - For itch: Topical steroid (Triamcinolone  0.1% ointment ) twice daily as needed + oral  antihistamine (zyrtec ) - For pain and swelling: Oral anti-inflammatory (ibuprofen), ice affected area       Follow up:  3  months   Thank you so much for letting me partake in your care today.  Don't hesitate to reach out if you have any additional concerns!  Hargis Springer, MD  [facility] [place] [place]

## 2024-10-20 ENCOUNTER — Ambulatory Visit (INDEPENDENT_AMBULATORY_CARE_PROVIDER_SITE_OTHER): Payer: Self-pay

## 2024-10-20 DIAGNOSIS — J3089 Other allergic rhinitis: Secondary | ICD-10-CM | POA: Diagnosis not present

## 2024-10-20 DIAGNOSIS — J302 Other seasonal allergic rhinitis: Secondary | ICD-10-CM | POA: Diagnosis not present

## 2024-10-20 NOTE — Progress Notes (Signed)
 VIALS MADE ON 10/20/24

## 2024-10-23 DIAGNOSIS — J3089 Other allergic rhinitis: Secondary | ICD-10-CM | POA: Diagnosis not present

## 2024-10-23 DIAGNOSIS — J301 Allergic rhinitis due to pollen: Secondary | ICD-10-CM | POA: Diagnosis not present

## 2024-10-23 DIAGNOSIS — J302 Other seasonal allergic rhinitis: Secondary | ICD-10-CM | POA: Diagnosis not present

## 2024-10-23 DIAGNOSIS — J3081 Allergic rhinitis due to animal (cat) (dog) hair and dander: Secondary | ICD-10-CM | POA: Diagnosis not present

## 2024-11-17 ENCOUNTER — Ambulatory Visit (INDEPENDENT_AMBULATORY_CARE_PROVIDER_SITE_OTHER)

## 2024-11-17 DIAGNOSIS — J302 Other seasonal allergic rhinitis: Secondary | ICD-10-CM | POA: Diagnosis not present

## 2024-11-17 DIAGNOSIS — J3089 Other allergic rhinitis: Secondary | ICD-10-CM | POA: Diagnosis not present

## 2024-12-20 ENCOUNTER — Other Ambulatory Visit: Payer: Self-pay | Admitting: Internal Medicine

## 2024-12-22 ENCOUNTER — Ambulatory Visit (INDEPENDENT_AMBULATORY_CARE_PROVIDER_SITE_OTHER)

## 2024-12-22 DIAGNOSIS — J302 Other seasonal allergic rhinitis: Secondary | ICD-10-CM | POA: Diagnosis not present

## 2024-12-29 ENCOUNTER — Ambulatory Visit (INDEPENDENT_AMBULATORY_CARE_PROVIDER_SITE_OTHER)

## 2024-12-29 DIAGNOSIS — J302 Other seasonal allergic rhinitis: Secondary | ICD-10-CM

## 2025-01-02 ENCOUNTER — Encounter: Payer: Self-pay | Admitting: Internal Medicine

## 2025-01-02 ENCOUNTER — Ambulatory Visit: Payer: Self-pay

## 2025-01-02 ENCOUNTER — Ambulatory Visit: Admitting: Internal Medicine

## 2025-01-02 VITALS — HR 79 | Ht 69.0 in | Wt 235.8 lb

## 2025-01-02 DIAGNOSIS — J302 Other seasonal allergic rhinitis: Secondary | ICD-10-CM

## 2025-01-02 DIAGNOSIS — H1045 Other chronic allergic conjunctivitis: Secondary | ICD-10-CM

## 2025-01-02 DIAGNOSIS — Z91018 Allergy to other foods: Secondary | ICD-10-CM

## 2025-01-02 DIAGNOSIS — J4550 Severe persistent asthma, uncomplicated: Secondary | ICD-10-CM

## 2025-01-02 MED ORDER — IPRATROPIUM-ALBUTEROL 0.5-2.5 (3) MG/3ML IN SOLN: 3.0000 mL | RESPIRATORY_TRACT | 1 refills | Status: AC | PRN

## 2025-01-02 MED ORDER — BREZTRI AEROSPHERE 160-9-4.8 MCG/ACT IN AERO: 2.0000 | INHALATION_SPRAY | Freq: Two times a day (BID) | RESPIRATORY_TRACT | 5 refills | Status: DC

## 2025-01-02 MED ORDER — AZELASTINE HCL 0.1 % NA SOLN: 2.0000 | Freq: Two times a day (BID) | NASAL | 11 refills | Status: DC

## 2025-01-02 MED ORDER — ALBUTEROL SULFATE HFA 108 (90 BASE) MCG/ACT IN AERS: 1.0000 | INHALATION_SPRAY | RESPIRATORY_TRACT | 5 refills | Status: AC | PRN

## 2025-01-04 ENCOUNTER — Other Ambulatory Visit: Payer: Self-pay | Admitting: *Deleted

## 2025-01-04 MED ORDER — AZELASTINE HCL 0.1 % NA SOLN: 2.0000 | Freq: Two times a day (BID) | NASAL | 1 refills | Status: AC

## 2025-01-04 MED ORDER — BREZTRI AEROSPHERE 160-9-4.8 MCG/ACT IN AERO: 2.0000 | INHALATION_SPRAY | Freq: Two times a day (BID) | RESPIRATORY_TRACT | 1 refills | Status: AC

## 2025-01-04 NOTE — Telephone Encounter (Signed)
 Received fax from [facility] for 90 day supply of Breztri  and Azelastine . Previously sent Tuesday for 30 day supply. This has been resent.

## 2025-04-02 ENCOUNTER — Ambulatory Visit: Admitting: Internal Medicine
